# Patient Record
Sex: Male | Born: 1955
Health system: Southern US, Community
[De-identification: ages and names within clinical notes are randomized; demographics above are authoritative.]

## PROBLEM LIST (undated history)

## (undated) DIAGNOSIS — I10 Essential (primary) hypertension: Secondary | ICD-10-CM

## (undated) DIAGNOSIS — E119 Type 2 diabetes mellitus without complications: Secondary | ICD-10-CM

## (undated) DIAGNOSIS — E785 Hyperlipidemia, unspecified: Secondary | ICD-10-CM

## (undated) DIAGNOSIS — Z9289 Personal history of other medical treatment: Secondary | ICD-10-CM

## (undated) HISTORY — DX: Personal history of other medical treatment: Z92.89

## (undated) HISTORY — DX: Hyperlipidemia, unspecified: E78.5

## (undated) HISTORY — DX: Essential (primary) hypertension: I10

---

## 1998-08-01 ENCOUNTER — Encounter: Payer: Self-pay | Admitting: Emergency Medicine

## 1998-08-01 ENCOUNTER — Emergency Department (HOSPITAL_COMMUNITY): Admission: EM | Admit: 1998-08-01 | Discharge: 1998-08-01 | Payer: Self-pay | Admitting: Emergency Medicine

## 2008-11-21 ENCOUNTER — Ambulatory Visit: Payer: Self-pay | Admitting: Occupational Medicine

## 2008-11-22 ENCOUNTER — Ambulatory Visit: Payer: Self-pay | Admitting: Occupational Medicine

## 2008-12-21 ENCOUNTER — Ambulatory Visit: Payer: Self-pay | Admitting: Family Medicine

## 2008-12-21 DIAGNOSIS — N400 Enlarged prostate without lower urinary tract symptoms: Secondary | ICD-10-CM | POA: Insufficient documentation

## 2008-12-22 ENCOUNTER — Encounter: Payer: Self-pay | Admitting: Family Medicine

## 2008-12-22 LAB — CONVERTED CEMR LAB: PSA: 0.28 ng/mL (ref 0.10–4.00)

## 2008-12-28 DIAGNOSIS — E785 Hyperlipidemia, unspecified: Secondary | ICD-10-CM | POA: Insufficient documentation

## 2009-02-04 ENCOUNTER — Telehealth: Payer: Self-pay | Admitting: Family Medicine

## 2009-08-20 ENCOUNTER — Telehealth (INDEPENDENT_AMBULATORY_CARE_PROVIDER_SITE_OTHER): Payer: Self-pay | Admitting: *Deleted

## 2010-02-02 ENCOUNTER — Ambulatory Visit: Payer: Self-pay | Admitting: Family Medicine

## 2010-02-02 DIAGNOSIS — L219 Seborrheic dermatitis, unspecified: Secondary | ICD-10-CM | POA: Insufficient documentation

## 2010-02-02 DIAGNOSIS — R351 Nocturia: Secondary | ICD-10-CM | POA: Insufficient documentation

## 2010-02-02 DIAGNOSIS — R319 Hematuria, unspecified: Secondary | ICD-10-CM | POA: Insufficient documentation

## 2010-02-02 DIAGNOSIS — I10 Essential (primary) hypertension: Secondary | ICD-10-CM | POA: Insufficient documentation

## 2010-02-02 LAB — CONVERTED CEMR LAB
Bilirubin Urine: NEGATIVE
Glucose, Urine, Semiquant: NEGATIVE
Ketones, urine, test strip: NEGATIVE
Nitrite: NEGATIVE
Protein, U semiquant: NEGATIVE
Specific Gravity, Urine: 1.015
Urobilinogen, UA: 0.2
WBC Urine, dipstick: NEGATIVE
pH: 6

## 2010-02-03 LAB — CONVERTED CEMR LAB
ALT: 20 units/L (ref 0–53)
AST: 17 units/L (ref 0–37)
Albumin: 4.6 g/dL (ref 3.5–5.2)
Alkaline Phosphatase: 65 units/L (ref 39–117)
BUN: 13 mg/dL (ref 6–23)
Bacteria, UA: NONE SEEN
CO2: 27 meq/L (ref 19–32)
Calcium: 9.6 mg/dL (ref 8.4–10.5)
Casts: NONE SEEN /lpf
Chloride: 103 meq/L (ref 96–112)
Cholesterol: 306 mg/dL — ABNORMAL HIGH (ref 0–200)
Creatinine, Ser: 0.99 mg/dL (ref 0.40–1.50)
Crystals: NONE SEEN
Glucose, Bld: 129 mg/dL — ABNORMAL HIGH (ref 70–99)
HDL: 32 mg/dL — ABNORMAL LOW (ref >39)
LDL Cholesterol: 226 mg/dL — ABNORMAL HIGH (ref 0–99)
PSA: 0.37 ng/mL (ref 0.10–4.00)
Potassium: 4 meq/L (ref 3.5–5.3)
RBC / HPF: NONE SEEN (ref <3)
Sodium: 139 meq/L (ref 135–145)
Squamous Epithelial / HPF: NONE SEEN /lpf
Total Bilirubin: 0.4 mg/dL (ref 0.3–1.2)
Total CHOL/HDL Ratio: 9.6
Total Protein: 7.4 g/dL (ref 6.0–8.3)
Triglycerides: 242 mg/dL — ABNORMAL HIGH (ref <150)
VLDL: 48 mg/dL — ABNORMAL HIGH (ref 0–40)
WBC, UA: NONE SEEN cells/hpf (ref <3)

## 2010-02-22 ENCOUNTER — Ambulatory Visit: Payer: Self-pay | Admitting: Family Medicine

## 2010-02-22 DIAGNOSIS — E118 Type 2 diabetes mellitus with unspecified complications: Secondary | ICD-10-CM | POA: Insufficient documentation

## 2010-02-22 DIAGNOSIS — E119 Type 2 diabetes mellitus without complications: Secondary | ICD-10-CM

## 2010-02-22 LAB — CONVERTED CEMR LAB: Hgb A1c MFr Bld: 7.5 %

## 2010-03-09 ENCOUNTER — Telehealth: Payer: Self-pay | Admitting: Family Medicine

## 2010-03-31 ENCOUNTER — Encounter: Payer: Self-pay | Admitting: Family Medicine

## 2010-04-19 ENCOUNTER — Encounter: Payer: Self-pay | Admitting: Family Medicine

## 2010-07-04 ENCOUNTER — Ambulatory Visit: Payer: Self-pay | Admitting: Family Medicine

## 2010-07-04 LAB — CONVERTED CEMR LAB
Creatinine,U: 200 mg/dL
Hgb A1c MFr Bld: 6.6 %
Microalbumin U total vol: 30 mg/L

## 2010-10-17 ENCOUNTER — Encounter: Payer: Self-pay | Admitting: Family Medicine

## 2010-10-17 ENCOUNTER — Ambulatory Visit: Payer: Self-pay | Admitting: Family Medicine

## 2010-10-18 LAB — CONVERTED CEMR LAB
Albumin: 4.7 g/dL (ref 3.5–5.2)
CO2: 28 meq/L (ref 19–32)
Calcium: 9.5 mg/dL (ref 8.4–10.5)
Chloride: 101 meq/L (ref 96–112)
Cholesterol: 279 mg/dL — ABNORMAL HIGH (ref 0–200)
Glucose, Bld: 133 mg/dL — ABNORMAL HIGH (ref 70–99)
Sodium: 138 meq/L (ref 135–145)
Total Bilirubin: 0.3 mg/dL (ref 0.3–1.2)
Total Protein: 7.4 g/dL (ref 6.0–8.3)
Triglycerides: 170 mg/dL — ABNORMAL HIGH (ref <150)
VLDL: 34 mg/dL (ref 0–40)

## 2010-12-04 LAB — CONVERTED CEMR LAB
ALT: 20 units/L (ref 0–53)
AST: 16 units/L (ref 0–37)
Albumin: 4.5 g/dL (ref 3.5–5.2)
Alkaline Phosphatase: 68 units/L (ref 39–117)
BUN: 15 mg/dL (ref 6–23)
CO2: 26 meq/L (ref 19–32)
Calcium: 9.8 mg/dL (ref 8.4–10.5)
Chloride: 104 meq/L (ref 96–112)
Cholesterol: 301 mg/dL — ABNORMAL HIGH (ref 0–200)
Creatinine, Ser: 0.98 mg/dL (ref 0.40–1.50)
Glucose, Bld: 127 mg/dL — ABNORMAL HIGH (ref 70–99)
HDL: 37 mg/dL — ABNORMAL LOW (ref >39)
LDL Cholesterol: 210 mg/dL — ABNORMAL HIGH (ref 0–99)
Potassium: 4.5 meq/L (ref 3.5–5.3)
Sodium: 141 meq/L (ref 135–145)
TSH: 0.543 microintl units/mL (ref 0.350–4.50)
Total Bilirubin: 0.3 mg/dL (ref 0.3–1.2)
Total CHOL/HDL Ratio: 8.1
Total Protein: 7.5 g/dL (ref 6.0–8.3)
Triglycerides: 269 mg/dL — ABNORMAL HIGH (ref <150)
VLDL: 54 mg/dL — ABNORMAL HIGH (ref 0–40)

## 2010-12-08 NOTE — Assessment & Plan Note (Signed)
Summary: f/u on DM, HTN   Vital Signs:  Patient profile:   55 year old male Height:      68.5 inches Weight:      211 pounds Pulse rate:   121 / minute BP sitting:   135 / 77  (left arm) Cuff size:   large  Vitals Entered By: Kathlene November (July 04, 2010 11:22 AM) CC: sugar check up  Flu Vaccine Consent Questions     Do you have a history of severe allergic reactions to this vaccine? no    Any prior history of allergic reactions to egg and/or gelatin? no    Do you have a sensitivity to the preservative Thimersol? no    Do you have a past history of Guillan-Barre Syndrome? no    Do you currently have an acute febrile illness? no    Have you ever had a severe reaction to latex? no    Vaccine information given and explained to patient? yes    Are you currently pregnant? no    Lot Number:[id number]   Exp Date:05/06/2011   Site Given  Left Deltoid IM  Primary Care Provider:  Nani Gasser MD  CC:  sugar check up.  History of Present Illness: The patient reports that he has attempted to cut down his caloric intake to 2500  per day and that he has For Breakfast, he reports that he has a couple of banannas or an apple with coffee, for lunch he is having a soup or salad with a chicken sandwich and for dinner he will have a Ruben sandwich with small fries.  He claims to have cut down his intake of sweet tea to 2 per week and has a biscuit maybe twice per week.  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   He has not met with a diabetes educator or dietician yet.  His exercise regimen has not been as consistent as he would like as well.  He denies polydipsia, dizziness, blurry vision, worsening of athelete's foot or chest pain or palpitations.    Diabetes Management History:      He says that he is not exercising regularly.    Current Medications (verified): 1)  Tribenzor 40-10-25 Mg Tabs (Olmesartan-Amlodipine-Hctz) .... Take 1 Tablet By Mouth  Once A Day 2)  Crestor 20 Mg Tabs (Rosuvastatin Calcium) .... Once A Day At Bedtime By Mouth 3)  Glucometer .... Strips and Lancets To Check Daily. Dx: 250.00  Allergies (verified): No Known Drug Allergies  Comments:  Nurse/Medical Assistant: The patient's medications and allergies were reviewed with the patient and were updated in the Medication and Allergy Lists. Kathlene November (July 04, 2010 11:23 AM)  Physical Exam  General:  alert, well-developed, and appropriate dress.   Head:  Normocephalic and atraumatic without obvious abnormalities. No apparent alopecia or balding. Lungs:  Clear to auscultation bilaterallly with no rales, rhonchi, or wheezes. Heart:  normal rate, regular rhythm, no murmur, no gallop, and no rub.   Abdomen:  soft, non-tender, and normal bowel sounds.   Pulses:  2+ bilaterally in all modalities tested Neurologic:  DTRs symmetrical and normal.   Psych:  normally interactive and good eye contact.    Diabetes Management Exam:    Foot Exam (with socks and/or shoes not present):       Sensory-Pinprick/Light touch:          Left medial foot (L-4): normal  Left dorsal foot (L-5): normal          Left lateral foot (S-1): normal          Right medial foot (L-4): normal          Right dorsal foot (L-5): normal          Right lateral foot (S-1): normal       Sensory-Monofilament:          Left foot: normal          Right foot: normal       Sensory-other: Vibratory sense normal bilaterally       Inspection:          Left foot: abnormal             Comments: Tinea Pedis Present          Right foot: abnormal             Comments: Tinea Pedis Present   Impression & Recommendations:  Problem # 1:  DIABETES MELLITUS (ICD-250.00) Congradulated patient about recent weight loss and dietary improvements that decreased A1C to 6.6%.  Strongly encouraged patient to follow up with a diabetes educator and a dietician.  Also suggested and encoraged home cooked  meals and the avodance of fast foods.  Also continued to stress the need for exercise.   Pnumococcoal and flu vac given today Urien micro negative.  REminded to get diabetic eye check and gave him information.  His updated medication list for this problem includes:    Tribenzor 40-10-25 Mg Tabs (Olmesartan-amlodipine-hctz) .Marland Kitchen... Take 1 tablet by mouth once a day  Orders: Fingerstick (36416) Hemoglobin A1C (83036) Creatinine  (16109) Urine Microalbumin (60454)  His updated medication list for this problem includes:    Tribenzor 40-10-25 Mg Tabs (Olmesartan-amlodipine-hctz) .Marland Kitchen... Take 1 tablet by mouth once a day  Labs Reviewed: Creat: 0.99 (02/02/2010)    Reviewed HgBA1c results: 7.5 (02/22/2010)  Problem # 2:  HYPERTENSION, BENIGN (ICD-401.1) Today the patient was almost at goal.  Would like for the patient to try and lose more weight before starting a beta-blocker. His updated medication list for this problem includes:    Tribenzor 40-10-25 Mg Tabs (Olmesartan-amlodipine-hctz) .Marland Kitchen... Take 1 tablet by mouth once a day  BP today: 135/77 Prior BP: 153/86 (02/22/2010)  Labs Reviewed: K+: 4.0 (02/02/2010) Creat: : 0.99 (02/02/2010)   Chol: 306 (02/02/2010)   HDL: 32 (02/02/2010)   LDL: 226 (02/02/2010)   TG: 242 (02/02/2010)  Complete Medication List: 1)  Tribenzor 40-10-25 Mg Tabs (Olmesartan-amlodipine-hctz) .... Take 1 tablet by mouth once a day 2)  Crestor 20 Mg Tabs (Rosuvastatin calcium) .... Once a day at bedtime by mouth 3)  Glucometer  .... Strips and lancets to check daily. dx: 250.00  Other Orders: Admin 1st Vaccine (09811) Flu Vaccine 45yrs + (91478) Pneumococcal Vaccine (29562) Admin of Any Addtl Vaccine (13086)  Patient Instructions: 1)  Given Pneumococcal and flu vaccine today. 2)  Please schedule a follow-up appointment in 3 months .  3)  Make sure to get your eye exam this year.   4)  [facility]: [address], [address],  [phone] Prescriptions: CRESTOR 20 MG TABS (ROSUVASTATIN CALCIUM) once a day at bedtime by mouth  #90 x 1   Entered and Authorized by:   Nani Gasser MD   Signed by:   Nani Gasser MD on 07/04/2010   Method used:   Electronically to        [facility]  [address] [phone]* (retail)       [address] [place], [place]  [address]       Ph: [phone]       Fax: [phone]   RxID:   [id number] Marya Landry 40-10-25 MG TABS Loura Halt) Take 1 tablet by mouth once a day  #90 x 0   Entered and Authorized by:   Nani Gasser MD   Signed by:   Nani Gasser MD on 07/04/2010   Method used:   Electronically to        [facility] [phone]* (retail)       [address] [place], [place]  [address]       Ph: [phone]       Fax: [phone]   RxID:   [id number]   Laboratory Results   Urine Tests  Date/Time Received: 07/04/2010 Date/Time Reported: 07/04/2010  Microalbumin (urine): 30 mg/L Creatinine: 200mg /dL  A:C Ratio 30mg /g  Blood Tests   Date/Time Received: 07/02/2010 Date/Time Reported: 07/02/2010  HGBA1C: 6.6%   (Normal Range: Non-Diabetic - 3-6%   Control Diabetic - 6-8%)        Immunizations Administered:  Pneumonia Vaccine:    Vaccine Type: Pneumovax    Site: left deltoid    Mfr: Merck    Dose: 0.5 ml    Route: IM    Given by: Kathlene November    Exp. Date: 01/21/2012    Lot #: [id number]    VIS given: 06/03/96 version given July 04, 2010.

## 2010-12-08 NOTE — Progress Notes (Signed)
Summary: Glucometer  Phone Note Call from Patient Call back at Home Phone [phone]   Caller: Patient Call For: Nani Gasser MD Summary of Call: Pt calls and needs rx for Glucometer, test strips and lancets sent to [facility]. Went to pick up after appointment and they did not have anything. Initial call taken by: Kathlene November,  Mar 09, 2010 11:43 AM  Follow-up for Phone Call        PT was already given rx.  Can reprint and fax or pt can pick up. It was printed same day as his appt.  Follow-up by: Nani Gasser MD,  Mar 09, 2010 12:25 PM    Prescriptions: GLUCOMETER Strips and lancets to check daily. Dx: 250.00  #30 days supp x 4   Entered by:   Kathlene November   Authorized by:   Nani Gasser MD   Signed by:   Kathlene November on 03/09/2010   Method used:   Print then Give to Patient   RxID:   [id number]

## 2010-12-08 NOTE — Procedures (Signed)
Summary: Colonoscopy/[facility]  Colonoscopy/[facility]   Imported By: Lanelle Bal 04/26/2010 12:02:05  _____________________________________________________________________  External Attachment:    Type:   Image     Comment:   External Document  Appended Document: Colonoscopy/[facility]  Flex Sig Next Due:  Not Indicated Colonoscopy Result Date:  04/19/2010 Colonoscopy Result:  normal Colonoscopy Next Due:  10 yr Hemoccult Next Due:  Not Indicated Last PSA Result:  0.37 (02/02/2010 11:53:00 PM) PSA Next Due:  1 yr

## 2010-12-08 NOTE — Letter (Signed)
Summary: Patient No Show/[facility]  Patient No Show/[facility]   Imported By: Lanelle Bal 04/12/2010 11:48:36  _____________________________________________________________________  External Attachment:    Type:   Image     Comment:   External Document

## 2010-12-08 NOTE — Assessment & Plan Note (Signed)
Summary: New dx DM, f/u on HTN   Vital Signs:  Patient profile:   55 year old male Height:      68.5 inches Weight:      219 pounds BMI:     32.93 Pulse rate:   105 / minute BP sitting:   153 / 86  (left arm) Cuff size:   large  Vitals Entered By: Kathlene November (February 22, 2010 8:25 AM) CC: discuss diabetes   Primary Care Provider:  Nani Gasser MD  CC:  discuss diabetes.  History of Present Illness: Just restarted his crestor about 4 days ago. Had been off of it for awhile. Her to f/u elevated glucose. It was 129 on his recent labs.   Current Medications (verified): 1)  Tribenzor 40-5-12.5 Mg Tabs (Olmesartan-Amlodipine-Hctz) .... Take 1 Tablet By Mouth Once A Day 2)  Crestor 20 Mg Tabs (Rosuvastatin Calcium) .... Once A Day At Bedtime By Mouth 3)  Ketoconazole 2 % Sham (Ketoconazole) .... Wash With Shampoo Daily For 2 Weeks, Then Twice A Week For Maintenance.  Allergies (verified): No Known Drug Allergies  Comments:  Nurse/Medical Assistant: The patient's medications and allergies were reviewed with the patient and were updated in the Medication and Allergy Lists. Kathlene November (February 22, 2010 8:26 AM)  Physical Exam  General:  Well-developed,well-nourished,in no acute distress; alert,appropriate and cooperative throughout examination Neck:  No deformities, masses, or tenderness noted. Lungs:  Normal respiratory effort, chest expands symmetrically. Lungs are clear to auscultation, no crackles or wheezes. Heart:  Normal rate and regular rhythm. S1 and S2 normal without gallop, murmur, click, rub or other extra sounds. Skin:  no suspicious lesions.   Cervical Nodes:  No lymphadenopathy noted Psych:  Cognition and judgment appear intact. Alert and cooperative with normal attention span and concentration. No apparent delusions, illusions, hallucinations   Impression & Recommendations:  Problem # 1:  DIABETES MELLITUS (ICD-250.00) Assessment New  Discussed that  based on his A1C he does have diabetes. Will work really hard on diet, weight loss, and exercise and f/u for repeat A1C in 3 months. Sicussed that if A1C not below 6.5 at that time will need to start a medication.  In meantime will give him rx for glucometer and have him check fasting sugars for 2 months. Given H.O. on carb and protion sizes and also some information on what an A1C is. Will refer for diabetes and nutrition counseling here in [place]. At next visit check microalbumin, discuss eye exam and give pneumococcal vaccines. 25 min spent face to face in counseling.  His updated medication list for this problem includes:    Tribenzor 40-10-25 Mg Tabs (Olmesartan-amlodipine-hctz) .Marland Kitchen... Take 1 tablet by mouth once a day  Orders: Fingerstick (36416) Hgb A1C (44034VQ) Diabetic Clinic Referral (Diabetic)  Problem # 2:  HYPERTENSION, BENIGN (ICD-401.1) Still not well controlled so will increase his dose on teh HCTZ and the amlodipine compoenents. REcheck at f/u in 3 months.  If still not well controlled consider adding a beta blocker.  His updated medication list for this problem includes:    Tribenzor 40-10-25 Mg Tabs (Olmesartan-amlodipine-hctz) .Marland Kitchen... Take 1 tablet by mouth once a day  Complete Medication List: 1)  Tribenzor 40-10-25 Mg Tabs (Olmesartan-amlodipine-hctz) .... Take 1 tablet by mouth once a day 2)  Crestor 20 Mg Tabs (Rosuvastatin calcium) .... Once a day at bedtime by mouth 3)  Ketoconazole 2 % Sham (Ketoconazole) .... Wash with shampoo daily for 2 weeks, then twice a week for maintenance.  4)  Glucometer  .... Strips and lancets to check daily. dx: 250.00   Patient Instructions: 1)  Check sugars twice a week.  2)  Please schedule a follow-up appointment in 3 months .  Will check your urine at the next visit.   Prescriptions: GLUCOMETER Strips and lancets to check daily. Dx: 250.00  #30 days supp x 4   Entered and Authorized by:   Nani Gasser MD   Signed by:    Nani Gasser MD on 02/22/2010   Method used:   Print then Give to Patient   RxID:   [id number] 40-10-25 MG TABS [facility]) Take 1 tablet by mouth once a day  #30 x 1   Entered and Authorized by:   Nani Gasser MD   Signed by:   Nani Gasser MD on 02/22/2010   Method used:   Electronically to        [facility] [phone]* (retail)       [address] [place], [place]  [address]       Ph: [phone]       Fax: [phone]   RxID:   [id number]   Laboratory Results   Blood Tests   Date/Time Received: 02/22/2010 Date/Time Reported: 02/22/2010  HGBA1C: 7.5%   (Normal Range: Non-Diabetic - 3-6%   Control Diabetic - 6-8%)

## 2010-12-08 NOTE — Assessment & Plan Note (Signed)
Summary: 3 mo f/u DM, HTN, etc   Vital Signs:  Patient profile:   55 year old male Height:      68.5 inches Weight:      214 pounds Pulse rate:   84 / minute BP sitting:   135 / 90  (right arm) Cuff size:   large  Vitals Entered By: Avon Gully CMA, Duncan Dull) (October 17, 2010 8:52 AM) CC: F/u DM and Hiogh BLd pressure, Lipid Management   CC:  F/u DM and Hiogh BLd pressure and Lipid Management.  Diabetes Management History:      The patient is a 55 years old male who comes in for evaluation of DM Type 2.  He has not been enrolled in the "Diabetic Education Program".  He states understanding of dietary principles and is following his diet appropriately.  No sensory loss is reported.  Self foot exams are being performed.  He says that he is not exercising regularly.        Hypoglycemic symptoms are not occurring.  No hyperglycemic symptoms are reported.        Since his last visit, no infections have occurred.  No changes have been made to his treatment plan since last visit.    Lipid Management History:      Positive NCEP/ATP III risk factors include male age 54 years old or older, diabetes, HDL cholesterol less than 40, and hypertension.  Negative NCEP/ATP III risk factors include no family history for ischemic heart disease and non-tobacco-user status.     Current Medications (verified): 1)  Tribenzor 40-10-25 Mg Tabs (Olmesartan-Amlodipine-Hctz) .... Take 1 Tablet By Mouth Once A Day 2)  Crestor 20 Mg Tabs (Rosuvastatin Calcium) .... Once A Day At Bedtime By Mouth 3)  Glucometer .... Strips and Lancets To Check Daily. Dx: 250.00  Allergies (verified): No Known Drug Allergies  Comments:  Nurse/Medical Assistant: The patient's medications and allergies were reviewed with the patient and were updated in the Medication and Allergy Lists. Avon Gully CMA, Duncan Dull) (October 17, 2010 8:53 AM)  Physical Exam  General:  Well-developed,well-nourished,in no acute distress;  alert,appropriate and cooperative throughout examination Head:  Normocephalic and atraumatic without obvious abnormalities. No apparent alopecia or balding. Eyes:  No corneal or conjunctival inflammation noted. EOMI. Perrla. Lungs:  Normal respiratory effort, chest expands symmetrically. Lungs are clear to auscultation, no crackles or wheezes. Heart:  Normal rate and regular rhythm. S1 and S2 normal without gallop, click, rub or other extra sounds. 1/6 SEM.  Skin:  no rashes.   Cervical Nodes:  No lymphadenopathy noted Psych:  Cognition and judgment appear intact. Alert and cooperative with normal attention span and concentration. No apparent delusions, illusions, hallucinations   Impression & Recommendations:  Problem # 1:  HYPERTENSION, BENIGN (ICD-401.1) Recently started working out about 2 weeks ago. He is trying to lose weight. His goal is to lose 20 lbs. I told him this would be wonderful and would help control his BP without having to make adjustments to his meds. If still SBP 135 net time then call add betablocker.  His updated medication list for this problem includes:    Tribenzor 40-10-25 Mg Tabs (Olmesartan-amlodipine-hctz) .Marland Kitchen... Take 1 tablet by mouth once a day  Problem # 2:  DIABETES MELLITUS (ICD-250.00) A1C 6.6. He is at goal.  Has cut out the sugars and sweets. Encourage to lose 10 lbs. Also discussed that if can continue to do a great job will be able to hold off on medications.  His updated medication list for this problem includes:    Tribenzor 40-10-25 Mg Tabs (Olmesartan-amlodipine-hctz) .Marland Kitchen... Take 1 tablet by mouth once a day  Orders: Fingerstick (36416) Hemoglobin A1C (83036)  Problem # 3:  HYPERLIPIDEMIA (ICD-272.4) Overdue to recheck chol.  He is toelrating the statin well.  His updated medication list for this problem includes:    Crestor 20 Mg Tabs (Rosuvastatin calcium) ..... Once a day at bedtime by mouth  Orders: T-Lipid Profile  ([address]-60454) T-Comprehensive Metabolic Panel [phone])  Complete Medication List: 1)  Tribenzor 40-10-25 Mg Tabs (Olmesartan-amlodipine-hctz) .... Take 1 tablet by mouth once a day 2)  Crestor 20 Mg Tabs (Rosuvastatin calcium) .... Once a day at bedtime by mouth 3)  Glucometer  .... Strips and lancets to check daily. dx: 250.00 4)  Cialis 20 Mg Tabs (Tadalafil) .... Take 1 tablet by mouth once a day as needed  Diabetes Management Assessment/Plan:      The following lipid goals have been established for the patient: Total cholesterol goal of 200; LDL cholesterol goal of 100; HDL cholesterol goal of 40; Triglyceride goal of 150.    Lipid Assessment/Plan:      Based on NCEP/ATP III, the patient's risk factor category is "history of diabetes".  The patient's lipid goals are as follows: Total cholesterol goal is 200; LDL cholesterol goal is 100; HDL cholesterol goal is 40; Triglyceride goal is 150.    Patient Instructions: 1)  Please schedule a follow-up appointment in 3 months for diabetes and blood pressure.  2)  Remember to get your eye exam.  Prescriptions: CIALIS 20 MG TABS (TADALAFIL) Take 1 tablet by mouth once a day as needed  #10 x 11   Entered and Authorized by:   Nani Gasser MD   Signed by:   Nani Gasser MD on 10/17/2010   Method used:   Electronically to        [facility] [phone]* (retail)       [address] [place], [place]  [address]       Ph: [phone]       Fax: [phone]   RxID:   [id number] CRESTOR 20 MG TABS (ROSUVASTATIN CALCIUM) once a day at bedtime by mouth  #90 x 2   Entered and Authorized by:   Nani Gasser MD   Signed by:   Nani Gasser MD on 10/17/2010   Method used:   Electronically to        [facility] [phone]* (retail)       [address] [place], [place]  [address]       Ph: [phone]       Fax: [phone]   RxID:   [id number] TRIBENZOR 40-10-25 MG TABS Loura Halt) Take 1  tablet by mouth once a day  #90 x 2   Entered and Authorized by:   Nani Gasser MD   Signed by:   Nani Gasser MD on 10/17/2010   Method used:   Electronically to        [facility] [phone]* (retail)       [address] [place], [place]  [address]       Ph: [phone]       Fax: [phone]   RxID:   [id number]    Orders Added: 1)  Fingerstick [36416] 2)  Hemoglobin A1C [83036] 3)  T-Lipid Profile [48546-27035]  4)  T-Comprehensive Metabolic Panel [80053-22900] 5)  Est. Patient Level III [14782]    Laboratory Results   Blood Tests   Date/Time Received: 10/17/10 Date/Time Reported: 10/17/10       Appended Document: 3 mo f/u DM, HTN, etc  Laboratory Results   Blood Tests   Date/Time Received: December 12, 20119:47 AM   HGBA1C: 6.6%   (Normal Range: Non-Diabetic - 3-6%   Control Diabetic - 6-8%)

## 2010-12-08 NOTE — Assessment & Plan Note (Signed)
Summary: CPE   Vital Signs:  Patient profile:   55 year old male Height:      68.5 inches Weight:      220 pounds BMI:     33.08 Pulse rate:   93 / minute BP sitting:   158 / 90  (left arm) Cuff size:   large  Vitals Entered By: Kathlene November (February 02, 2010 10:49 AM) CC: CPE   Primary Care Provider:  Nani Gasser MD  CC:  CPE.  History of Present Illness: Having to pee twice at night for years. Dneis any problems during the day with urinary stream or hesisancey. Denies any hematuria but says on his DOT physical was told he had some protein in his urine.  c/o dry flakey itchy patches on his scalp and neck.    Current Medications (verified): 1)  Lisinopril 40 Mg Tabs (Lisinopril) .... Take 1 Tablet By Mouth Once A Day 2)  Amlodipine Besylate 10 Mg Tabs (Amlodipine Besylate) .... Take 1 Tablet By Mouth Once A Day 3)  Crestor 20 Mg Tabs (Rosuvastatin Calcium) .... Once A Day At Bedtime By Mouth  Allergies (verified): No Known Drug Allergies  Comments:  Nurse/Medical Assistant: The patient's medications and allergies were reviewed with the patient and were updated in the Medication and Allergy Lists. 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  HS degree. Divorced.  Lives alone. denies tobacco use drinks 12 per month denies recreational drug use  Review of Systems  The patient denies anorexia, fever, weight loss, weight gain, vision loss, decreased hearing, hoarseness, chest pain, syncope, dyspnea on exertion, peripheral edema, prolonged cough, headaches, hemoptysis, abdominal pain,  melena, hematochezia, severe indigestion/heartburn, hematuria, incontinence, genital sores, muscle weakness, suspicious skin lesions, transient blindness, difficulty walking, depression, unusual weight change, abnormal bleeding, enlarged lymph nodes, and breast masses.    Physical Exam  General:  Well-developed,well-nourished,in no acute distress; alert,appropriate and cooperative throughout examination. Obese.  Head:  Normocephalic and atraumatic without obvious abnormalities. No apparent alopecia or balding. Eyes:  No corneal or conjunctival inflammation noted. EOMI. Perrla. Ears:  External ear exam shows no significant lesions or deformities.  Otoscopic examination reveals clear canals, tympanic membranes are intact bilaterally without bulging, retraction, inflammation or discharge. Hearing is grossly normal bilaterally. Nose:  External nasal examination shows no deformity or inflammation. Nasal mucosa are pink and moist without lesions or exudates. Mouth:  Oral mucosa and oropharynx without lesions or exudates.  Teeth in good repair. Neck:  No deformities, masses, or tenderness noted. Chest Wall:  No deformities, masses, tenderness or gynecomastia noted. Lungs:  Normal respiratory effort, chest expands symmetrically. Lungs are clear to auscultation, no crackles or wheezes. Heart:  Normal rate and regular rhythm. S1 and S2 normal without gallop, murmur, click, rub or other extra sounds. Abdomen:  Bowel sounds positive,abdomen soft and non-tender without masses, organomegaly or hernias noted. Rectal:  no external abnormalities.   Prostate:  1+ enlarged.   Msk:  No deformity or scoliosis noted of thoracic or lumbar spine.   Pulses:  R and L carotid,radial,dorsalis pedis and posterior tibial pulses are full and equal bilaterally Extremities:  No clubbing, cyanosis, edema, or deformity  noted with normal full range of motion of all joints.   Neurologic:  No cranial nerve deficits noted. Station and  gait are normal. DTRs are symmetrical throughout. Sensory, motor and coordinative functions appear intact. Skin:  dry scaling patches in his scalp.  Cervical Nodes:  No lymphadenopathy noted Psych:  Cognition and judgment appear intact. Alert and cooperative with normal attention span and concentration. No apparent delusions, illusions, hallucinations   Impression & Recommendations:  Problem # 1:  HEALTH MAINTENANCE EXAM (ICD-V70.0) Due for screening labs.  Encouraged more regular exercise and weight lose. He is obest.   UA is + for blood so will send for a micro for further evaluation. Will also check his PSA. His protstate was mildly enlarged but is symmetric with no nodules. BPH could be contributing to his nightime sxs.   Orders: T-Comprehensive Metabolic Panel [id number]) T-PSA [id number]) T-Lipid Profile ([id number]) Gastroenterology Referral (GI)  Problem # 2:  DERMATITIS, SEBORRHEIC (ICD-690.10) Treat with ketoconazole shampoo.    Problem # 3:  HYPERTENSION, BENIGN (ICD-401.1) Has not been taking his betablocker. Didnt' know he was supposed to be on it. Will change him to tribenzor for simplicity and have him f/u in one month to recheck his BP.  Samples given for 4 weeks.  The following medications were removed from the medication list:    Amlodipine Besylate 10 Mg Tabs (Amlodipine besylate) .Marland Kitchen... Take 1 tablet by mouth once a day    Metoprolol Tartrate 50 Mg Tabs (Metoprolol tartrate) .Marland Kitchen... Take 1 tablet by mouth two times a day His updated medication list for this problem includes:    Tribenzor 40-5-12.5 Mg Tabs (Olmesartan-amlodipine-hctz) .Marland Kitchen... Take 1 tablet by mouth once a day  Complete Medication List: 1)  Tribenzor 40-5-12.5 Mg Tabs (Olmesartan-amlodipine-hctz) .... Take 1 tablet by mouth once a day 2)  Crestor 20 Mg Tabs (Rosuvastatin calcium) .... Once a day at bedtime by mouth 3)  Ketoconazole 2 % Sham (Ketoconazole) .... Wash with shampoo daily for 2 weeks,  then twice a week for maintenance.  Other Orders: UA Dipstick w/o Micro (automated)  ([id number]) T-Urine Microscopic ([id number])   Patient Instructions: 1)  We will schedule a colonoscopy for you to check for signs of colon cancer.   2)  We will call you with our lab results in a couple of days.   Prescriptions: KETOCONAZOLE 2 % SHAM (KETOCONAZOLE) Wash with shampoo daily for 2 weeks, then twice a week for maintenance.  #2 bottle x 3   Entered and Authorized by:   Nani Gasser MD   Signed by:   Nani Gasser MD on 02/02/2010   Method used:   Electronically to        [facility] [phone]* (retail)       [address] [place], [place]  [address]       Ph: [phone]       Fax: [phone]   RxID:   [id number]    Laboratory Results   Urine Tests  Date/Time Received: 02/02/2010 Date/Time Reported: 02/02/2010  Routine Urinalysis   Color: yellow Appearance: Clear Glucose: negative   (Normal Range: Negative) Bilirubin: negative   (Normal Range: Negative) Ketone: negative   (Normal Range: Negative) Spec. Gravity: 1.015   (Normal Range: 1.003-1.035) Blood: trace-intact   (Normal Range: Negative) pH: 6.0   (Normal Range: 5.0-8.0) Protein: negative   (Normal Range: Negative) Urobilinogen: 0.2   (Normal Range: 0-1) Nitrite: negative   (Normal Range: Negative) Leukocyte  Esterace: negative   (Normal Range: Negative)

## 2011-01-16 ENCOUNTER — Ambulatory Visit: Payer: Self-pay | Admitting: Family Medicine

## 2011-02-02 ENCOUNTER — Encounter: Payer: Self-pay | Admitting: Family Medicine

## 2011-02-06 ENCOUNTER — Ambulatory Visit: Payer: Self-pay | Admitting: Family Medicine

## 2011-03-05 ENCOUNTER — Encounter: Payer: Self-pay | Admitting: Family Medicine

## 2011-03-06 ENCOUNTER — Encounter: Payer: Self-pay | Admitting: Family Medicine

## 2011-03-06 ENCOUNTER — Ambulatory Visit (INDEPENDENT_AMBULATORY_CARE_PROVIDER_SITE_OTHER): Payer: BC Managed Care – PPO | Admitting: Family Medicine

## 2011-03-06 DIAGNOSIS — E785 Hyperlipidemia, unspecified: Secondary | ICD-10-CM

## 2011-03-06 DIAGNOSIS — E8881 Metabolic syndrome: Secondary | ICD-10-CM

## 2011-03-06 DIAGNOSIS — R7309 Other abnormal glucose: Secondary | ICD-10-CM

## 2011-03-06 DIAGNOSIS — I10 Essential (primary) hypertension: Secondary | ICD-10-CM

## 2011-03-06 MED ORDER — PITAVASTATIN CALCIUM 4 MG PO TABS: 4.0000 mg | ORAL_TABLET | Freq: Every day | ORAL | Status: DC

## 2011-03-06 NOTE — Assessment & Plan Note (Signed)
BPs are controlled at home and the med he took this AM was an old sample med since he ran out yesterday adn it was much lower dose than what he normlall takes. REfll med. Congratulated him on weight loss.  Keep up the great work.

## 2011-03-06 NOTE — Patient Instructions (Signed)
We will call you with your lab results. Follow up in 6 months 

## 2011-03-06 NOTE — Assessment & Plan Note (Signed)
Discussed dx. Will recheck A1C. If still well controlled will recheck in one year. He has done fantastic with his weight loss.

## 2011-03-06 NOTE — Progress Notes (Signed)
  Subjective:    Patient ID: ARVINE CLAYBURN, male    DOB: 12/18/1955, 55 y.o.   MRN: [id number]  Hypertension This is a chronic problem. The problem has been gradually worsening since onset. The problem is controlled. Pertinent negatives include no chest pain, peripheral edema or shortness of breath. There are no associated agents to hypertension. Risk factors for coronary artery disease include no known risk factors. Past treatments include ACE inhibitors and diuretics. There are no compliance problems.   has lost even more weight  Crestor too expensive. Wants an alternative.     Review of Systems  Respiratory: Negative for shortness of breath.   Cardiovascular: Negative for chest pain.       Objective:   Physical Exam  Constitutional: He is oriented to person, place, and time. He appears well-developed and well-nourished.  HENT:  Head: Normocephalic and atraumatic.  Cardiovascular: Normal rate, regular rhythm and normal heart sounds.   Pulmonary/Chest: Effort normal and breath sounds normal.  Neurological: He is alert and oriented to person, place, and time.  Skin: Skin is warm and dry.  Psychiatric: He has a normal mood and affect.          Assessment & Plan:

## 2011-03-06 NOTE — Assessment & Plan Note (Signed)
Will change to livalo with coupon card for cost reason.  Recheck lipids in 6 mo.

## 2011-03-07 ENCOUNTER — Telehealth: Payer: Self-pay | Admitting: Family Medicine

## 2011-03-07 LAB — COMPLETE METABOLIC PANEL WITH GFR
Albumin: 4.8 g/dL (ref 3.5–5.2)
CO2: 28 mEq/L (ref 19–32)
Calcium: 9.8 mg/dL (ref 8.4–10.5)
Chloride: 100 mEq/L (ref 96–112)
GFR, Est African American: >60 mL/min (ref >60)
GFR, Est Non African American: >60 mL/min (ref >60)
Glucose, Bld: 119 mg/dL — ABNORMAL HIGH (ref 70–99)
Potassium: 4.4 mEq/L (ref 3.5–5.3)
Sodium: 139 mEq/L (ref 135–145)
Total Protein: 7.7 g/dL (ref 6.0–8.3)

## 2011-03-07 LAB — LIPID PANEL: LDL Cholesterol: 135 mg/dL — ABNORMAL HIGH (ref 0–99)

## 2011-03-07 NOTE — Telephone Encounter (Signed)
Call pt: LDL is down into the 130s. Much improved.  Lets recheck in 6 months on the new chol pill call livalo.  Continue to exercise and eat a low fat diet.

## 2011-03-07 NOTE — Telephone Encounter (Signed)
Left message with results on vm and to return to lab to check A1c because he didn't get lab slip at appt

## 2011-04-14 ENCOUNTER — Encounter: Payer: Self-pay | Admitting: Family Medicine

## 2011-08-26 ENCOUNTER — Other Ambulatory Visit: Payer: Self-pay | Admitting: Family Medicine

## 2011-08-28 ENCOUNTER — Other Ambulatory Visit: Payer: Self-pay | Admitting: *Deleted

## 2011-08-28 MED ORDER — OLMESARTAN-AMLODIPINE-HCTZ 40-10-25 MG PO TABS: 40.0000 mg | ORAL_TABLET | Freq: Every day | ORAL | Status: DC

## 2011-10-02 ENCOUNTER — Ambulatory Visit: Payer: BC Managed Care – PPO | Admitting: Family Medicine

## 2012-06-03 ENCOUNTER — Other Ambulatory Visit: Payer: Self-pay | Admitting: *Deleted

## 2012-06-03 MED ORDER — PITAVASTATIN CALCIUM 4 MG PO TABS: 4.0000 mg | ORAL_TABLET | Freq: Every day | ORAL | Status: DC

## 2012-07-01 ENCOUNTER — Telehealth: Payer: Self-pay | Admitting: Family Medicine

## 2012-07-01 NOTE — Telephone Encounter (Signed)
Patient notified and sent to FD to be scheduled

## 2012-07-01 NOTE — Telephone Encounter (Signed)
Call pt: Neesd diabetic appt

## 2012-07-15 ENCOUNTER — Ambulatory Visit (INDEPENDENT_AMBULATORY_CARE_PROVIDER_SITE_OTHER): Payer: BC Managed Care – PPO | Admitting: Family Medicine

## 2012-07-15 ENCOUNTER — Encounter: Payer: Self-pay | Admitting: Family Medicine

## 2012-07-15 VITALS — BP 143/89 | HR 105 | Wt 209.0 lb

## 2012-07-15 DIAGNOSIS — I10 Essential (primary) hypertension: Secondary | ICD-10-CM

## 2012-07-15 DIAGNOSIS — E785 Hyperlipidemia, unspecified: Secondary | ICD-10-CM

## 2012-07-15 DIAGNOSIS — E119 Type 2 diabetes mellitus without complications: Secondary | ICD-10-CM

## 2012-07-15 LAB — POCT GLYCOSYLATED HEMOGLOBIN (HGB A1C): Hemoglobin A1C: 6.9

## 2012-07-15 LAB — POCT UA - MICROALBUMIN
Albumin/Creatinine Ratio, Urine, POC: <30
Creatinine, POC: 200 mg/dL

## 2012-07-15 MED ORDER — OLMESARTAN-AMLODIPINE-HCTZ 40-10-25 MG PO TABS: 1.0000 | ORAL_TABLET | Freq: Every day | ORAL | Status: DC

## 2012-07-15 MED ORDER — PITAVASTATIN CALCIUM 4 MG PO TABS: 4.0000 mg | ORAL_TABLET | Freq: Every day | ORAL | Status: DC

## 2012-07-15 MED ORDER — METFORMIN HCL 500 MG PO TABS: 500.0000 mg | ORAL_TABLET | Freq: Two times a day (BID) | ORAL | Status: DC

## 2012-07-15 NOTE — Progress Notes (Signed)
  Subjective:    Patient ID: Steven Estrada, male    DOB: 1955/11/25, 56 y.o.   MRN: [id number]  HPI DM- Says sugars runing under 130. No hypoglycemic events.  Taking meds except for his statin. He is not on medications and he is currently diet controlled.  Hyperlpidemia - off his statin. He denies any problems with it he just ran out and no longer had refills. No myalgias on medication.  HTN- EAting a low salt diet.  Not working out for 3 months. No chest pain or shortness of breath. Tolerating medications well.    Review of Systems     Objective:   Physical Exam  Constitutional: He is oriented to person, place, and time. He appears well-developed and well-nourished.  HENT:  Head: Normocephalic and atraumatic.  Cardiovascular: Normal rate, regular rhythm and normal heart sounds.   Pulmonary/Chest: Effort normal and breath sounds normal.  Neurological: He is alert and oriented to person, place, and time.  Skin: Skin is warm and dry.  Psychiatric: He has a normal mood and affect. His behavior is normal.          Assessment & Plan:  DM- His A1C is 6.9.  Elevated . Will start metformin 500mg  bid. Discussed potential side effects. Followup in 3 months. Start an exercise program and really make sure eating a low salt low sugar diet.  HTN - Not well controlled. Start diet and exercise. Continue work on low-salt diet. Coupon card given for [employer]. Recheck at followup in one month during his physical. BP Readings from Last 3 Encounters:  07/15/12 143/89  03/06/11 160/94  10/17/10 135/90     Hyperlipidemia-restart statin to her to the lab in about 2 weeks to recheck CMP and lipid panel.  He is also interested in getting a complete physical. Encouraged him to schedule this in one month. We can recheck his blood pressure at that time.

## 2012-07-15 NOTE — Patient Instructions (Addendum)
Schedule a CPE in one month Work on low salt diet and exercise.  Follow up for diabetes in 3 months.

## 2012-08-26 ENCOUNTER — Encounter: Payer: BC Managed Care – PPO | Admitting: Family Medicine

## 2012-09-30 ENCOUNTER — Encounter: Payer: BC Managed Care – PPO | Admitting: Family Medicine

## 2012-09-30 LAB — COMPLETE METABOLIC PANEL WITH GFR
ALT: 16 U/L (ref 0–53)
AST: 15 U/L (ref 0–37)
Albumin: 4.5 g/dL (ref 3.5–5.2)
Alkaline Phosphatase: 60 U/L (ref 39–117)
GFR, Est Non African American: 85 mL/min
Glucose, Bld: 116 mg/dL — ABNORMAL HIGH (ref 70–99)
Potassium: 4.4 mEq/L (ref 3.5–5.3)
Sodium: 136 mEq/L (ref 135–145)
Total Protein: 7.3 g/dL (ref 6.0–8.3)

## 2012-09-30 LAB — LIPID PANEL
LDL Cholesterol: 88 mg/dL (ref 0–99)
Total CHOL/HDL Ratio: 6.8 Ratio

## 2012-09-30 NOTE — Progress Notes (Signed)
  Subjective:    Patient ID: Steven Estrada, male    DOB: Feb 15, 1956, 55 y.o.   MRN: [id number]  HPI    Review of Systems     Objective:   Physical Exam        Assessment & Plan:

## 2012-10-07 ENCOUNTER — Ambulatory Visit: Payer: BC Managed Care – PPO | Admitting: Family Medicine

## 2012-10-07 DIAGNOSIS — Z0289 Encounter for other administrative examinations: Secondary | ICD-10-CM

## 2012-11-04 ENCOUNTER — Encounter: Payer: Self-pay | Admitting: Family Medicine

## 2012-11-04 ENCOUNTER — Ambulatory Visit (INDEPENDENT_AMBULATORY_CARE_PROVIDER_SITE_OTHER): Payer: BC Managed Care – PPO | Admitting: Family Medicine

## 2012-11-04 VITALS — BP 138/76 | HR 119 | Resp 18 | Ht 67.0 in | Wt 213.0 lb

## 2012-11-04 DIAGNOSIS — Z0000 Encounter for general adult medical examination without abnormal findings: Secondary | ICD-10-CM

## 2012-11-04 DIAGNOSIS — R0789 Other chest pain: Secondary | ICD-10-CM

## 2012-11-04 DIAGNOSIS — N400 Enlarged prostate without lower urinary tract symptoms: Secondary | ICD-10-CM

## 2012-11-04 DIAGNOSIS — E119 Type 2 diabetes mellitus without complications: Secondary | ICD-10-CM

## 2012-11-04 DIAGNOSIS — Z23 Encounter for immunization: Secondary | ICD-10-CM

## 2012-11-04 DIAGNOSIS — Z125 Encounter for screening for malignant neoplasm of prostate: Secondary | ICD-10-CM

## 2012-11-04 LAB — POCT UA - MICROALBUMIN
Albumin/Creatinine Ratio, Urine, POC: <30
Creatinine, POC: 200 mg/dL

## 2012-11-04 MED ORDER — TERAZOSIN HCL 1 MG PO CAPS: 1.0000 mg | ORAL_CAPSULE | Freq: Every day | ORAL | Status: DC

## 2012-11-04 NOTE — Progress Notes (Signed)
Subjective:    Patient ID: Steven Estrada, male    DOB: 1956-10-04, 56 y.o.   MRN: [id number]  HPI He is here for complete physical today. His only complaint is some occasional chest pain. He said the only time it bothers him is when he is on the treadmill working out. He will notice some discomfort in the center of his upper chest. He thinks it may just be more of a musculoskeletal type strain to the doesn't bother him otherwise. He says if he stops working out it immediately disappears and restart as soon as he starts working out again. He says he just pushes through it for a couple minutes it actually seems to go away with continued exercise. He denies any diaphoresis, shortness of breath, radiation of this discomfort into the arms or neck. He denies any prior history of heart disease. No significant family history of heart disease. He is diabetic and has a history of high blood pressure that he's been well controlled for both diseases.  Still waking up 2x at night to pee. Says the flomax didn't work well. No side effects he just felt it was not helpful.  DM- no cuts ore sores that aren't healing well. No low. Taking meds regualarly. No hypoglycemic events. He works out regularly. He is only taking metformin once a day.   Review of Systems  BP 138/76  Pulse 119  Resp 18  Ht 5\' 7"  (1.702 m)  Wt 213 lb (96.616 kg)  BMI 33.36 kg/m2  SpO2 96%    No Known Allergies  Past Medical History  Diagnosis Date  . Hyperlipidemia   . Hypertension     Past Surgical History  Procedure Date  . No past surgeries     History   Social History  . Marital Status: Divorced    Spouse Name: N/A    Number of Children: 1  . Years of Education: N/A   Occupational History  . TRUCK DRIVER    Social History Main Topics  . Smoking status: Never Smoker   . Smokeless tobacco: Never Used  . Alcohol Use: Yes     Comment: 12 per month  . Drug Use: No  . Sexually Active:    Other Topics Concern  .  Not on file   Social History Narrative   Works out [employer].     Family History  Problem Relation Age of Onset  . Hypertension Sister   . Hypertension Brother   . Hypertension Sister   . Heart failure Mother 38    Outpatient Encounter Prescriptions as of 11/04/2012  Medication Sig Dispense Refill  . metFORMIN (GLUCOPHAGE) 500 MG tablet Take 500 mg by mouth daily with breakfast.      . NON FORMULARY Glucometer, strips, and lancets to check dailly       . Olmesartan-Amlodipine-HCTZ (TRIBENZOR) 40-10-25 MG TABS Take 1 tablet by mouth daily.  30 tablet  11  . Pitavastatin Calcium (LIVALO) 4 MG TABS Take 1 tablet (4 mg total) by mouth at bedtime.  30 tablet  11  . tadalafil (CIALIS) 20 MG tablet Take 20 mg by mouth daily as needed.        . [DISCONTINUED] metFORMIN (GLUCOPHAGE) 500 MG tablet Take 1 tablet (500 mg total) by mouth 2 (two) times daily with a meal.  60 tablet  3          Objective:   Physical Exam  Constitutional: He is oriented to person, place, and time. He  appears well-developed and well-nourished.  HENT:  Head: Normocephalic and atraumatic.  Right Ear: External ear normal.  Left Ear: External ear normal.  Nose: Nose normal.  Mouth/Throat: Oropharynx is clear and moist.  Eyes: Conjunctivae normal and EOM are normal. Pupils are equal, round, and reactive to light.  Neck: Normal range of motion. Neck supple. No thyromegaly present.  Cardiovascular: Normal rate, regular rhythm, normal heart sounds and intact distal pulses.   Pulmonary/Chest: Effort normal and breath sounds normal.  Abdominal: Soft. Bowel sounds are normal. He exhibits no distension and no mass. There is no tenderness. There is no rebound and no guarding.  Musculoskeletal: Normal range of motion.  Lymphadenopathy:    He has no cervical adenopathy.  Neurological: He is alert and oriented to person, place, and time. He has normal reflexes.  Skin: Skin is warm and dry.  Psychiatric: He has a normal  mood and affect. His behavior is normal. Judgment and thought content normal.          Assessment & Plan:  CPE-overall think is doing well there. His vaccines are up-to-date. We will check a PSA. Colonoscopy is up-to-date.. Lab work is otherwise up-to-date. Recheck triglycerides.  Diabetes-very well controlled. Continue current regimen. Followup in 3-4 months.  Hypertension-well-controlled. Continue current regimen. Next  Benign prostatic hypertrophy-he says he didn't do well with the Flomax we will try Hytrin. We can discuss further his followup in 3 months to see if it's working well and titrate his dose as needed.  Atypical chest pain-EKG shows 80 beats per minute, normal sinus rhythm. It does look like he has inverted T waves in lead 3, aVF and possibly in lead 2. No significant ST or T wave elevation or abnormality. He also has T wave inversion in V6. Suspect possible old inferior infarct. We will schedule for a treadmill stress test. This will be able to recreate his symptoms and we can see if there's any evidence of ischemia.

## 2012-11-04 NOTE — Patient Instructions (Addendum)
Start 81mg  Aspirin daily. Enteric coated. One a day.

## 2012-11-05 LAB — PSA, TOTAL AND FREE
PSA, Free: 0.16 ng/mL
PSA: 0.51 ng/mL (ref <=4.00)

## 2012-11-05 LAB — TRIGLYCERIDES: Triglycerides: 177 mg/dL — ABNORMAL HIGH (ref <150)

## 2012-11-07 ENCOUNTER — Encounter: Payer: Self-pay | Admitting: *Deleted

## 2012-11-11 ENCOUNTER — Other Ambulatory Visit: Payer: Self-pay

## 2012-11-11 DIAGNOSIS — I10 Essential (primary) hypertension: Secondary | ICD-10-CM

## 2012-11-11 DIAGNOSIS — E785 Hyperlipidemia, unspecified: Secondary | ICD-10-CM

## 2012-11-11 MED ORDER — OLMESARTAN-AMLODIPINE-HCTZ 40-10-25 MG PO TABS: 1.0000 | ORAL_TABLET | Freq: Every day | ORAL | Status: DC

## 2012-11-11 MED ORDER — PITAVASTATIN CALCIUM 4 MG PO TABS: 4.0000 mg | ORAL_TABLET | Freq: Every day | ORAL | Status: DC

## 2012-11-14 ENCOUNTER — Encounter: Payer: BC Managed Care – PPO | Admitting: Nurse Practitioner

## 2012-12-02 ENCOUNTER — Encounter: Payer: BC Managed Care – PPO | Admitting: Physician Assistant

## 2012-12-30 ENCOUNTER — Ambulatory Visit (INDEPENDENT_AMBULATORY_CARE_PROVIDER_SITE_OTHER): Payer: Self-pay | Admitting: Physician Assistant

## 2012-12-30 DIAGNOSIS — R0789 Other chest pain: Secondary | ICD-10-CM

## 2012-12-30 DIAGNOSIS — R9439 Abnormal result of other cardiovascular function study: Secondary | ICD-10-CM

## 2012-12-30 NOTE — Patient Instructions (Addendum)
Your physician has requested that you have en exercise stress myoview. For further information please visit [email or web address]. Please follow instruction sheet, as given.   

## 2012-12-30 NOTE — Procedures (Signed)
Steven Estrada is a 56 y.o. male referred by PCP for ETT. nohxof CAD. PMH significant for DM2, HTN.  Non-smoker.  No FHx of CAD.  He is a Naval architect.  Notes exertional chest pain over the last several mos. No associated symptoms. No dyspnea. No syncope.  Exam unremarkable.  ECG with inf Q waves.  Exercise Treadmill Test  Pre-Exercise Testing Evaluation Rhythm: normal sinus  Rate: 107     Test  Exercise Tolerance Test Ordering [id number] Santina Evans MD  Interpreting MD: Tereso Newcomer PA-C  Unique Test No: 1  Treadmill:  1  Indication for ETT: chest pain - rule out ischemia  Contraindication to ETT: No   Stress Modality: exercise - treadmill  Cardiac Imaging Performed: non   Protocol: standard Bruce - maximal  Max BP:  172/98  Max MPHR (bpm): 166  85% MPR (bpm):  141  MPHR obtained (bpm):  162 % MPHR obtained:  98%  Reached 85% MPHR (min:sec):  3:35 Total Exercise Time (min-sec):  7:01  Workload in METS:  8.4 Borg Scale: 12  Reason ETT Terminated:  patient's desire to stop    ST Segment Analysis At Rest: non-specific ST segment slurring With Exercise: borderline ST changes  Other Information Arrhythmia:  No Angina during ETT:  absent (0) Quality of ETT:  indeterminate  ETT Interpretation:  borderline (indeterminate) with non-specific ST changes  Comments: Fair exercise tolerance. No chest pain. Normal BP response to exercise. There were borderline ST changes in the lateral leads at peak activity.  This was worse in recovery and involved the inferolateral leads.  Cannot rule out ischemia.   Recommendations: Patient will be scheduled for an ETT-Myoview. SignedTereso Newcomer, PA-C  10:01 AM 12/30/2012

## 2013-01-13 ENCOUNTER — Ambulatory Visit (HOSPITAL_COMMUNITY): Payer: BC Managed Care – PPO | Attending: Internal Medicine | Admitting: Radiology

## 2013-01-13 VITALS — BP 119/83 | HR 90 | Ht 68.0 in | Wt 213.0 lb

## 2013-01-13 DIAGNOSIS — R079 Chest pain, unspecified: Secondary | ICD-10-CM

## 2013-01-13 DIAGNOSIS — R0789 Other chest pain: Secondary | ICD-10-CM

## 2013-01-13 DIAGNOSIS — R9431 Abnormal electrocardiogram [ECG] [EKG]: Secondary | ICD-10-CM | POA: Insufficient documentation

## 2013-01-13 DIAGNOSIS — R9439 Abnormal result of other cardiovascular function study: Secondary | ICD-10-CM

## 2013-01-13 MED ORDER — TECHNETIUM TC 99M SESTAMIBI GENERIC - CARDIOLITE
30.0000 | Freq: Once | INTRAVENOUS | Status: AC | PRN
  Administered 2013-01-13: 30 via INTRAVENOUS

## 2013-01-13 MED ORDER — TECHNETIUM TC 99M SESTAMIBI GENERIC - CARDIOLITE
10.0000 | Freq: Once | INTRAVENOUS | Status: AC | PRN
  Administered 2013-01-13: 10 via INTRAVENOUS

## 2013-01-13 NOTE — Progress Notes (Signed)
  [facility] SITE 3 NUCLEAR MED [address] [place], [place] [address] [phone]    Cardiology Nuclear Med Study  Steven Estrada is a 57 y.o. male     MRN : [id number]     DOB: [birth year]  Procedure Date: 01/13/2013  Nuclear Med Background Indication for Stress Test:  Evaluation for Ischemia and Abnormal GXT History:  12/30/12 [id number] ST changes Cardiac Risk Factors: Hypertension, Lipids, NIDDM and Obesity  Symptoms:  Chest Pain with Exertion (last date of chest discomfort was about 3-4 weeks ago)   Nuclear Pre-Procedure Caffeine/Decaff Intake:  None NPO After: 8:00pm   Lungs:  Clear. O2 Sat: 96% on room air. IV 0.9% NS with Angio Cath:  20g  IV Site: R Hand  IV Started by:  Cathlyn Parsons, RN  Chest Size (in):  44 Cup Size: n/a  Height: 5\' 8"  (1.727 m)  Weight:  213 lb (96.616 kg)  BMI:  Body mass index is 32.39 kg/(m^2). Tech Comments:  n/a    Nuclear Med Study 1 or 2 day study: 1 day  Stress Test Type:  Stress  Reading MD: Dietrich Pates, MD  Order Authorizing Rivers Hamrick:  Nani Gasser, MD and Tereso Newcomer, PA-C  Resting Radionuclide: Technetium 20m Sestamibi  Resting Radionuclide Dose: 11.0 mCi   Stress Radionuclide:  Technetium 41m Sestamibi  Stress Radionuclide Dose: 33.0 mCi           Stress Protocol Rest HR: 90 Stress HR: 164  Rest BP: 119/83 Stress BP: 165/85  Exercise Time (min): 9:00 METS: 10.1   Predicted Max HR: 166 bpm % Max HR: 98.8 bpm Rate Pressure Product: 29528   Dose of Adenosine (mg):  n/a Dose of Lexiscan: n/a mg  Dose of Atropine (mg): n/a Dose of Dobutamine: n/a mcg/kg/min (at max HR)  Stress Test Technologist: Smiley Houseman, CMA-N  Nuclear Technologist:  Domenic Polite, CNMT     Rest Procedure:  Myocardial perfusion imaging was performed at rest 45 minutes following the intravenous administration of Technetium 32m Sestamibi.  Rest ECG: NSR.    Stress Procedure:  The patient exercised on the treadmill  utilizing the Bruce Protocol for nine minutes. The patient stopped due to fatigue and denied any chest pain.  Technetium 66m Sestamibi was injected at peak exercise and myocardial perfusion imaging was performed after a brief delay.  Stress ECG: In recovery period ST depression in leads II, III (1 mm) and 1 mm in V5/V6.  Incompletely normalized by end of recovery.  QPS Raw Data Images: Images were motion corrected.  SOft tissue (diaphragm, bowel activity) underlie heart. Stress Images: Small to medium defect in the inferior wall (base, mid).  Otherwise normal perfusion.   Rest Images:  Near normalization of inferior wall.  Subtraction (SDS):  These findings are consistent with ischemia. Transient Ischemic Dilatation (Normal <1.22):  0.99 Lung/Heart Ratio (Normal <0.45):  0.29  Quantitative Gated Spect Images QGS EDV:  94 ml QGS ESV:  41 ml  Impression Exercise Capacity:  Good exercise capacity. BP Response:  Normal blood pressure response. Clinical Symptoms:  No chest pain. ECG Impression: ST changes consistent with ischemia . See above. Comparison with Prior Nuclear Study: No previous nuclear study performed  Overall Impression:  Myoview scan with evidence of mild/moderate ischemia in inferior wall (base, mid)  LV Ejection Fraction: 56%.  LV Wall Motion:  NL LV Function; NL Wall Motion  Dietrich Pates

## 2013-01-14 ENCOUNTER — Encounter: Payer: Self-pay | Admitting: Physician Assistant

## 2013-01-15 ENCOUNTER — Telehealth: Payer: Self-pay | Admitting: Family Medicine

## 2013-01-15 ENCOUNTER — Telehealth: Payer: Self-pay | Admitting: *Deleted

## 2013-01-15 NOTE — Telephone Encounter (Signed)
Message copied by Tarri Fuller on Wed Jan 15, 2013  4:52 PM ------      Message from: [place], [place] T      Created: Tue Jan 14, 2013 10:10 AM       Stress test is abnormal with inferior ischemia.        He needs an appointment with one of our cardiologists for evaluation.  He was referred by PCP for ETT and has never seen one of our cardiologists.      He is a new patient and has to see the doctor (not the PA/NP).      Please notify METHENEY,CATHERINE, MD that patient will be set up for consultation.      Patient needs appointment this week.      Tereso Newcomer, PA-C  10:10 AM 01/14/2013 ------

## 2013-01-15 NOTE — Telephone Encounter (Signed)
Patient had abnormal stress test and Netta Cedars, PA referred him for a consult with Dr. Patty Sermons at [facility].  His appt is 01/17/13.  Scott just wanted you to know since you sent him for this test.

## 2013-01-15 NOTE — Telephone Encounter (Signed)
lmptcb about stress test results, pt has new pt appt w/[facility] 01/17/13 for abn myoview. I called Dr. Loman Chroman to let he know that we are having pt see cardio

## 2013-01-16 ENCOUNTER — Telehealth: Payer: Self-pay | Admitting: Physician Assistant

## 2013-01-16 NOTE — Telephone Encounter (Signed)
cb pt to advise of abnormal myoview results, pt moved his new pt appt with [facility] to 3/17. I advised pt to make sure he keeps this appt with Dr. Brackbill in order for dr. to discuss myoview 

## 2013-01-16 NOTE — Telephone Encounter (Signed)
Pt calling again, pls call with results

## 2013-01-16 NOTE — Telephone Encounter (Signed)
New Problem: ° ° ° °Patient returned your call.  Please call back. °

## 2013-01-16 NOTE — Telephone Encounter (Signed)
cb pt to advise of abnormal myoview results, pt moved his new pt appt with [facility] to 3/17. I advised pt to make sure he keeps this appt with Dr. Patty Sermons in order for dr. to discuss [facility]

## 2013-01-17 ENCOUNTER — Ambulatory Visit: Payer: BC Managed Care – PPO | Admitting: Cardiology

## 2013-01-20 ENCOUNTER — Ambulatory Visit (INDEPENDENT_AMBULATORY_CARE_PROVIDER_SITE_OTHER): Payer: BC Managed Care – PPO | Admitting: Cardiology

## 2013-01-20 ENCOUNTER — Encounter: Payer: Self-pay | Admitting: Cardiology

## 2013-01-20 ENCOUNTER — Encounter: Payer: Self-pay | Admitting: *Deleted

## 2013-01-20 ENCOUNTER — Encounter (HOSPITAL_COMMUNITY): Payer: Self-pay | Admitting: Pharmacy Technician

## 2013-01-20 VITALS — BP 137/87 | HR 116 | Ht 68.0 in | Wt 214.8 lb

## 2013-01-20 DIAGNOSIS — R079 Chest pain, unspecified: Secondary | ICD-10-CM

## 2013-01-20 DIAGNOSIS — Z01818 Encounter for other preprocedural examination: Secondary | ICD-10-CM

## 2013-01-20 NOTE — Progress Notes (Signed)
Steven Estrada Date of Birth:  07/19/1956 [facility] [address] [place], [place]  [address] [phone]         Fax   [phone]  History of Present Illness: This pleasant 57 year old [place] American male is seen by me for the first time today.  He comes in for discussion of his recent Myoview stress test which was abnormal.  He gives a history of having had some exertional chest tightness when working out several months ago.  He had a treadmill stress test which was abnormal with borderline ST changes on 12/30/12.  That reason he returned to the office on 01/13/13 for a treadmill Myoview stress test.  The Myoview showed normal left ventricular systolic function with an ejection fraction of 56% but did show evidence of mild to moderate ischemia of the inferior wall involving the base and mid portions of the inferior wall.  The patient has had no recent chest tightness because he has decreased the intensity of his exercise work-outs.  The patient has multiple risk factors for ischemic heart disease.  He has a history of hypercholesterolemia.  He has diabetes and he has history of essential hypertension. His family history reveals that his mother had congestive heart failure and died at age 43.  His father died of old age but not of heart problems. 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  Current Outpatient Prescriptions  Medication Sig Dispense Refill  . aspirin 81 MG tablet Take 81 mg by mouth daily.      . fish oil-omega-3 fatty acids 1000 MG capsule Take 1 g by mouth daily.      . metFORMIN (GLUCOPHAGE) 500 MG tablet Take 500 mg by mouth daily with breakfast.      . NON FORMULARY Glucometer, strips, and lancets to check dailly       . Olmesartan-Amlodipine-HCTZ (TRIBENZOR) 40-10-25 MG TABS Take 1 tablet by mouth daily.  30 tablet  5  . Pitavastatin Calcium (LIVALO) 4 MG TABS Take 1 tablet (4 mg total) by mouth at bedtime.  30  tablet  5  . tadalafil (CIALIS) 20 MG tablet Take 20 mg by mouth daily as needed for erectile dysfunction.       Marland Kitchen terazosin (HYTRIN) 1 MG capsule Take 1 capsule (1 mg total) by mouth at bedtime.  30 capsule  2   No current facility-administered medications for this visit.    No Known Allergies  Patient Active Problem List  Diagnosis  . Type II or unspecified type diabetes mellitus without mention of complication, not stated as uncontrolled  . HYPERLIPIDEMIA  . HYPERTENSION, BENIGN  . HEMATURIA UNSPECIFIED  . HYPERTROPHY PROSTATE W/O UR OBST & OTH LUTS  . DERMATITIS, SEBORRHEIC  . NOCTURIA  . Chest pain    History  Smoking status  . Never Smoker   Smokeless tobacco  . Never Used    History  Alcohol Use  . Yes    Comment: 12 per month    Family History  Problem Relation Age of Onset  . Hypertension Sister   . Hypertension Brother   . Hypertension Sister   . Heart failure Mother 18    Review of Systems: Constitutional: no fever chills diaphoresis or fatigue or change in weight.  Head and neck: no hearing loss, no epistaxis, no photophobia or visual disturbance. Respiratory: No cough, shortness of breath or wheezing. Cardiovascular: No  peripheral edema, palpitations.  Positive for  exertional chest pain with vigorous exercise Gastrointestinal: No abdominal distention, no abdominal pain, no change in bowel habits hematochezia or melena. Genitourinary: No dysuria, no frequency, no urgency, no nocturia. Musculoskeletal:No arthralgias, no back pain, no gait disturbance or myalgias. Neurological: No dizziness, no headaches, no numbness, no seizures, no syncope, no weakness, no tremors. Hematologic: No lymphadenopathy, no easy bruising. Psychiatric: No confusion, no hallucinations, no sleep disturbance.    Physical Exam: Filed Vitals:   01/20/13 1059  BP: 137/87  Pulse: 116   the general appearance reveals a well-developed well-nourished middle-aged gentleman in no  distress.The head and neck exam reveals pupils equal and reactive.  Extraocular movements are full.  There is no scleral icterus.  The mouth and pharynx are normal.  The neck is supple.  The carotids reveal no bruits.  The jugular venous pressure is normal.  The  thyroid is not enlarged.  There is no lymphadenopathy.  The chest is clear to percussion and auscultation.  There are no rales or rhonchi.  Expansion of the chest is symmetrical.  The precordium is quiet.  The first heart sound is normal.  The second heart sound is physiologically split.  There is no murmur gallop rub or click.  There is no abnormal lift or heave.  The abdomen is soft and nontender.  The bowel sounds are normal.  The liver and spleen are not enlarged.  There are no abdominal masses.  There are no abdominal bruits.  Extremities reveal good pedal pulses.  There is no phlebitis or edema.  There is no cyanosis or clubbing.  Strength is normal and symmetrical in all extremities.  There is no lateralizing weakness.  There are no sensory deficits.  The skin is warm and dry.  There is no rash.     Assessment / Plan: My impression is that this gentleman has a history consistent with exertional angina at high levels of effort.  His recent Myoview stress test showed reversible inferior wall ischemia of mild to moderate degree.  Ejection fraction was 56%  Recommendation is that he continue his same medication.  He has not been taking it daily 81 mg aspirin and we encouraged him to start doing that now.  We will arrange for diagnostic left heart cardiac catheterization with possible PCI.  This will be done in the [facility] catheter lab by Dr. [place].  The patient will come for preoperative lab work on 01/30/13.  He will not take his metformin for 2 days prior to catheter. The risks and benefits of a cardiac catheterization including, but not limited to, death, stroke, MI, kidney damage and bleeding were discussed with the patient who  indicates understanding and agrees to proceed.

## 2013-01-20 NOTE — Patient Instructions (Addendum)
Restart your Aspirin 81 mg daily  Your physician has requested that you have a cardiac catheterization. Cardiac catheterization is used to diagnose and/or treat various heart conditions. Doctors may recommend this procedure for a number of different reasons. The most common reason is to evaluate chest pain. Chest pain can be a symptom of coronary artery disease (CAD), and cardiac catheterization can show whether plaque is narrowing or blocking your heart's arteries. This procedure is also used to evaluate the valves, as well as measure the blood flow and oxygen levels in different parts of your heart. For further information please visit [email or web address] Please follow instruction sheet, as given.  WILL TRY TO GET THIS ARRANGED NEXT Friday 3/28, WILL CALL YOU WITH THE DETAILS  WOULD LIKE FOR YOU TO RETURN ON 3/27 FOR PRECATH LABS AND YOUR INSTRUCTION LETTER

## 2013-01-30 ENCOUNTER — Ambulatory Visit (INDEPENDENT_AMBULATORY_CARE_PROVIDER_SITE_OTHER)
Admission: RE | Admit: 2013-01-30 | Discharge: 2013-01-30 | Disposition: A | Discharge status: Home / Self Care | Payer: BC Managed Care – PPO | Source: Ambulatory Visit | Attending: Cardiology | Admitting: Cardiology

## 2013-01-30 ENCOUNTER — Other Ambulatory Visit (INDEPENDENT_AMBULATORY_CARE_PROVIDER_SITE_OTHER): Payer: BC Managed Care – PPO

## 2013-01-30 DIAGNOSIS — R079 Chest pain, unspecified: Secondary | ICD-10-CM

## 2013-01-30 DIAGNOSIS — Z01818 Encounter for other preprocedural examination: Secondary | ICD-10-CM

## 2013-01-30 LAB — BASIC METABOLIC PANEL
BUN: 15 mg/dL (ref 6–23)
Chloride: 100 mEq/L (ref 96–112)
Creatinine, Ser: 1 mg/dL (ref 0.4–1.5)
GFR: 103.4 mL/min (ref >60.00)

## 2013-01-30 LAB — CBC WITH DIFFERENTIAL/PLATELET
Basophils Absolute: 0 10*3/uL (ref 0.0–0.1)
Eosinophils Relative: 1 % (ref 0.0–5.0)
MCV: 80.5 fl (ref 78.0–100.0)
Monocytes Absolute: 0.3 10*3/uL (ref 0.1–1.0)
Neutrophils Relative %: 57.4 % (ref 43.0–77.0)
Platelets: 278 10*3/uL (ref 150.0–400.0)
WBC: 6.4 10*3/uL (ref 4.5–10.5)

## 2013-01-30 LAB — PROTIME-INR
INR: 1.2 ratio — ABNORMAL HIGH (ref 0.8–1.0)
Prothrombin Time: 12.3 s (ref 10.2–12.4)

## 2013-01-31 ENCOUNTER — Encounter (HOSPITAL_COMMUNITY)
Admission: RE | Disposition: A | Discharge status: Home / Self Care | Payer: Self-pay | Source: Ambulatory Visit | Attending: Cardiology

## 2013-01-31 ENCOUNTER — Ambulatory Visit (HOSPITAL_COMMUNITY)
Admission: RE | Admit: 2013-01-31 | Discharge: 2013-01-31 | Disposition: A | Discharge status: Home / Self Care | Payer: BC Managed Care – PPO | Source: Ambulatory Visit | Attending: Cardiology | Admitting: Cardiology

## 2013-01-31 ENCOUNTER — Telehealth: Payer: Self-pay | Admitting: Cardiology

## 2013-01-31 DIAGNOSIS — E119 Type 2 diabetes mellitus without complications: Secondary | ICD-10-CM | POA: Insufficient documentation

## 2013-01-31 DIAGNOSIS — R079 Chest pain, unspecified: Secondary | ICD-10-CM

## 2013-01-31 DIAGNOSIS — I2510 Atherosclerotic heart disease of native coronary artery without angina pectoris: Secondary | ICD-10-CM | POA: Insufficient documentation

## 2013-01-31 DIAGNOSIS — E7800 Pure hypercholesterolemia, unspecified: Secondary | ICD-10-CM | POA: Insufficient documentation

## 2013-01-31 DIAGNOSIS — E785 Hyperlipidemia, unspecified: Secondary | ICD-10-CM | POA: Insufficient documentation

## 2013-01-31 DIAGNOSIS — Z01818 Encounter for other preprocedural examination: Secondary | ICD-10-CM

## 2013-01-31 DIAGNOSIS — I10 Essential (primary) hypertension: Secondary | ICD-10-CM | POA: Insufficient documentation

## 2013-01-31 DIAGNOSIS — R9439 Abnormal result of other cardiovascular function study: Secondary | ICD-10-CM | POA: Insufficient documentation

## 2013-01-31 HISTORY — PX: LEFT HEART CATHETERIZATION WITH CORONARY ANGIOGRAM: SHX5451

## 2013-01-31 LAB — GLUCOSE, CAPILLARY: Glucose-Capillary: 137 mg/dL — ABNORMAL HIGH (ref 70–99)

## 2013-01-31 LAB — BASIC METABOLIC PANEL
Calcium: 9.3 mg/dL (ref 8.4–10.5)
Chloride: 101 mEq/L (ref 96–112)
Creatinine, Ser: 1.01 mg/dL (ref 0.50–1.35)
GFR calc Af Amer: >90 mL/min (ref >90)
GFR calc non Af Amer: 82 mL/min — ABNORMAL LOW (ref >90)

## 2013-01-31 SURGERY — LEFT HEART CATHETERIZATION WITH CORONARY ANGIOGRAM
Anesthesia: LOCAL

## 2013-01-31 MED ORDER — DIAZEPAM 5 MG PO TABS
5.0000 mg | ORAL_TABLET | ORAL | Status: AC
  Administered 2013-01-31: 5 mg via ORAL

## 2013-01-31 MED ORDER — HEPARIN SODIUM (PORCINE) 1000 UNIT/ML IJ SOLN
INTRAMUSCULAR | Status: AC
  Filled 2013-01-31: qty 1

## 2013-01-31 MED ORDER — SODIUM CHLORIDE 0.9 % IV SOLN: 250.0000 mL | INTRAVENOUS | Status: DC | PRN

## 2013-01-31 MED ORDER — MIDAZOLAM HCL 2 MG/2ML IJ SOLN
INTRAMUSCULAR | Status: AC
  Filled 2013-01-31: qty 2

## 2013-01-31 MED ORDER — VERAPAMIL HCL 2.5 MG/ML IV SOLN
INTRAVENOUS | Status: AC
  Filled 2013-01-31: qty 2

## 2013-01-31 MED ORDER — FENTANYL CITRATE 0.05 MG/ML IJ SOLN
INTRAMUSCULAR | Status: AC
Start: 2013-01-31 — End: 2013-01-31
  Filled 2013-01-31: qty 2

## 2013-01-31 MED ORDER — ASPIRIN 81 MG PO CHEW
CHEWABLE_TABLET | ORAL | Status: AC
  Filled 2013-01-31: qty 4

## 2013-01-31 MED ORDER — DIAZEPAM 5 MG PO TABS
ORAL_TABLET | ORAL | Status: AC
  Filled 2013-01-31: qty 1

## 2013-01-31 MED ORDER — LIDOCAINE HCL (PF) 1 % IJ SOLN
INTRAMUSCULAR | Status: AC
  Filled 2013-01-31: qty 30

## 2013-01-31 MED ORDER — ASPIRIN 81 MG PO CHEW
324.0000 mg | CHEWABLE_TABLET | ORAL | Status: AC
  Administered 2013-01-31: 324 mg via ORAL

## 2013-01-31 MED ORDER — HEPARIN (PORCINE) IN NACL 2-0.9 UNIT/ML-% IJ SOLN
INTRAMUSCULAR | Status: AC
  Filled 2013-01-31: qty 1000

## 2013-01-31 MED ORDER — SODIUM CHLORIDE 0.9 % IJ SOLN: 3.0000 mL | INTRAMUSCULAR | Status: DC | PRN

## 2013-01-31 MED ORDER — SODIUM CHLORIDE 0.9 % IJ SOLN: 3.0000 mL | Freq: Two times a day (BID) | INTRAMUSCULAR | Status: DC

## 2013-01-31 MED ORDER — POTASSIUM CHLORIDE CRYS ER 20 MEQ PO TBCR
40.0000 meq | EXTENDED_RELEASE_TABLET | Freq: Once | ORAL | Status: AC
  Administered 2013-01-31: 40 meq via ORAL
  Filled 2013-01-31: qty 2

## 2013-01-31 MED ORDER — SODIUM CHLORIDE 0.9 % IV SOLN
INTRAVENOUS | Status: DC
  Administered 2013-01-31: 75 mL/h via INTRAVENOUS
  Administered 2013-01-31: 06:00:00 via INTRAVENOUS

## 2013-01-31 NOTE — Telephone Encounter (Signed)
New Prob    Wanting to confirm prescription that recently came in for motorprolol. Would like to speak to nurse.

## 2013-01-31 NOTE — Interval H&P Note (Signed)
History and Physical Interval Note:  01/31/2013 7:58 AM  Steven Estrada  has presented today for surgery, with the diagnosis of cad  The various methods of treatment have been discussed with the patient and family. After consideration of risks, benefits and other options for treatment, the patient has consented to  Procedure(s): LEFT HEART CATHETERIZATION WITH CORONARY ANGIOGRAM (N/A) as a surgical intervention .  The patient's history has been reviewed, patient examined, no change in status, stable for surgery.  I have reviewed the patient's chart and labs.  Questions were answered to the patient's satisfaction.     Theron Arista [facility] 01/31/2013 7:58 AM

## 2013-01-31 NOTE — CV Procedure (Signed)
   Cardiac Catheterization Procedure Note  Name: Steven Estrada MRN: [id number] DOB: [birth year]  Procedure: Left Heart Cath, Selective Coronary Angiography, LV angiography  Indication: 57 year old black male with history of hypertension, hyperlipidemia, and diabetes mellitus presents with exertional angina class II. Stress Myoview study shows evidence of inferior wall ischemia.   Procedural Details: The right wrist was prepped, draped, and anesthetized with 1% lidocaine. Using the modified Seldinger technique, a 5 French sheath was introduced into the right radial artery. 3 mg of verapamil was administered through the sheath, weight-based unfractionated heparin was administered intravenously. Standard Judkins catheters were used for selective coronary angiography and left ventriculography. Catheter exchanges were performed over an exchange length guidewire. There were no immediate procedural complications. A TR band was used for radial hemostasis at the completion of the procedure.  The patient was transferred to the post catheterization recovery area for further monitoring.  Procedural Findings: Hemodynamics: AO 111/71 with a mean of 92 mmHg LV 111/16 mmHg  Coronary angiography: Coronary dominance: right  Left mainstem: There is 20-30% narrowing of the distal left main.  Left anterior descending (LAD): The left anterior descending artery is a large vessel that extends to the apex. It has mild disease in the proximal to mid vessel up to 20%. The distal vessel tapers and appears diffusely diseased without focal stenosis.  There is a large ramus intermediate branch which has mild irregularities less than 10%.  Left circumflex (LCx): The left circumflex is a moderate size vessel. It gives rise to 2 marginal branches distally. In the mid vessel just prior to the first marginal branch there is a 60% stenosis. Following the first marginal branch there is a 70% stenosis.  Right coronary artery  (RCA): The right coronary is a dominant vessel. It has severe diffuse disease in the mid vessel up to 95%. The distal vessel is occluded at the crux. There are left to right collaterals from the left coronary primarily through septal perforators.  Left ventriculography: Left ventricular systolic function is normal, LVEF is estimated at 55-65%, there is no significant mitral regurgitation   Final Conclusions:  1. 2 vessel obstructive coronary disease. There are moderate lesions in the mid to distal circumflex. The right coronary is chronically occluded with left to right collaterals. 2. Normal left ventricular function.  Recommendations: I recommend initial therapy with medication. We will add metoprolol to his current medications. If the patient continues to have refractory angina despite 2 antianginal agents he would be a candidate for percutaneous intervention with stenting of the circumflex and CTO PCI of the right coronary.  Theron Arista [facility] 01/31/2013, 8:31 AM

## 2013-01-31 NOTE — Telephone Encounter (Signed)
Returned call to pharmacist at [facility] she stated a PA already returned phone call and it was already taken care of.

## 2013-01-31 NOTE — H&P (View-Only) (Signed)
Steven Estrada Date of Birth:  07/12/1956 [facility] [address] [place], [place]  [address] [phone]         Fax   [phone]  History of Present Illness: This pleasant 56-year-old African American male is seen by me for the first time today.  He comes in for discussion of his recent Myoview stress test which was abnormal.  He gives a history of having had some exertional chest tightness when working out several months ago.  He had a treadmill stress test which was abnormal with borderline ST changes on 12/30/12.  That reason he returned to the office on 01/13/13 for a treadmill Myoview stress test.  The Myoview showed normal left ventricular systolic function with an ejection fraction of 56% but did show evidence of mild to moderate ischemia of the inferior wall involving the base and mid portions of the inferior wall.  The patient has had no recent chest tightness because he has decreased the intensity of his exercise work-outs.  The patient has multiple risk factors for ischemic heart disease.  He has a history of hypercholesterolemia.  He has diabetes and he has history of essential hypertension. His family history reveals that his mother had congestive heart failure and died at age 89.  His father died of old age but not of heart problems. 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  Current Outpatient Prescriptions  Medication Sig Dispense Refill  . aspirin 81 MG tablet Take 81 mg by mouth daily.      . fish oil-omega-3 fatty acids 1000 MG capsule Take 1 g by mouth daily.      . metFORMIN (GLUCOPHAGE) 500 MG tablet Take 500 mg by mouth daily with breakfast.      . NON FORMULARY Glucometer, strips, and lancets to check dailly       . Olmesartan-Amlodipine-HCTZ (TRIBENZOR) 40-10-25 MG TABS Take 1 tablet by mouth daily.  30 tablet  5  . Pitavastatin Calcium (LIVALO) 4 MG TABS Take 1 tablet (4 mg total) by mouth at bedtime.  30  tablet  5  . tadalafil (CIALIS) 20 MG tablet Take 20 mg by mouth daily as needed for erectile dysfunction.       . terazosin (HYTRIN) 1 MG capsule Take 1 capsule (1 mg total) by mouth at bedtime.  30 capsule  2   No current facility-administered medications for this visit.    No Known Allergies  Patient Active Problem List  Diagnosis  . Type II or unspecified type diabetes mellitus without mention of complication, not stated as uncontrolled  . HYPERLIPIDEMIA  . HYPERTENSION, BENIGN  . HEMATURIA UNSPECIFIED  . HYPERTROPHY PROSTATE W/O UR OBST & OTH LUTS  . DERMATITIS, SEBORRHEIC  . NOCTURIA  . Chest pain    History  Smoking status  . Never Smoker   Smokeless tobacco  . Never Used    History  Alcohol Use  . Yes    Comment: 12 per month    Family History  Problem Relation Age of Onset  . Hypertension Sister   . Hypertension Brother   . Hypertension Sister   . Heart failure Mother 80    Review of Systems: Constitutional: no fever chills diaphoresis or fatigue or change in weight.  Head and neck: no hearing loss, no epistaxis, no photophobia or visual disturbance. Respiratory: No cough, shortness of breath or wheezing. Cardiovascular: No  peripheral edema, palpitations.  Positive for   exertional chest pain with vigorous exercise Gastrointestinal: No abdominal distention, no abdominal pain, no change in bowel habits hematochezia or melena. Genitourinary: No dysuria, no frequency, no urgency, no nocturia. Musculoskeletal:No arthralgias, no back pain, no gait disturbance or myalgias. Neurological: No dizziness, no headaches, no numbness, no seizures, no syncope, no weakness, no tremors. Hematologic: No lymphadenopathy, no easy bruising. Psychiatric: No confusion, no hallucinations, no sleep disturbance.    Physical Exam: Filed Vitals:   01/20/13 1059  BP: 137/87  Pulse: 116   the general appearance reveals a well-developed well-nourished middle-aged gentleman in no  distress.The head and neck exam reveals pupils equal and reactive.  Extraocular movements are full.  There is no scleral icterus.  The mouth and pharynx are normal.  The neck is supple.  The carotids reveal no bruits.  The jugular venous pressure is normal.  The  thyroid is not enlarged.  There is no lymphadenopathy.  The chest is clear to percussion and auscultation.  There are no rales or rhonchi.  Expansion of the chest is symmetrical.  The precordium is quiet.  The first heart sound is normal.  The second heart sound is physiologically split.  There is no murmur gallop rub or click.  There is no abnormal lift or heave.  The abdomen is soft and nontender.  The bowel sounds are normal.  The liver and spleen are not enlarged.  There are no abdominal masses.  There are no abdominal bruits.  Extremities reveal good pedal pulses.  There is no phlebitis or edema.  There is no cyanosis or clubbing.  Strength is normal and symmetrical in all extremities.  There is no lateralizing weakness.  There are no sensory deficits.  The skin is warm and dry.  There is no rash.     Assessment / Plan: My impression is that this gentleman has a history consistent with exertional angina at high levels of effort.  His recent Myoview stress test showed reversible inferior wall ischemia of mild to moderate degree.  Ejection fraction was 56%  Recommendation is that he continue his same medication.  He has not been taking it daily 81 mg aspirin and we encouraged him to start doing that now.  We will arrange for diagnostic left heart cardiac catheterization with possible PCI.  This will be done in the [facility] catheter lab by Dr. Jordan.  The patient will come for preoperative lab work on 01/30/13.  He will not take his metformin for 2 days prior to catheter. The risks and benefits of a cardiac catheterization including, but not limited to, death, stroke, MI, kidney damage and bleeding were discussed with the patient who  indicates understanding and agrees to proceed.    

## 2013-02-03 ENCOUNTER — Encounter: Payer: Self-pay | Admitting: Family Medicine

## 2013-02-03 ENCOUNTER — Ambulatory Visit (INDEPENDENT_AMBULATORY_CARE_PROVIDER_SITE_OTHER): Payer: BC Managed Care – PPO | Admitting: Family Medicine

## 2013-02-03 VITALS — BP 108/63 | HR 75 | Wt 209.0 lb

## 2013-02-03 DIAGNOSIS — I10 Essential (primary) hypertension: Secondary | ICD-10-CM

## 2013-02-03 DIAGNOSIS — E119 Type 2 diabetes mellitus without complications: Secondary | ICD-10-CM

## 2013-02-03 DIAGNOSIS — I2510 Atherosclerotic heart disease of native coronary artery without angina pectoris: Secondary | ICD-10-CM | POA: Insufficient documentation

## 2013-02-03 LAB — POCT GLYCOSYLATED HEMOGLOBIN (HGB A1C): Hemoglobin A1C: 6.4

## 2013-02-03 MED ORDER — METFORMIN HCL 500 MG PO TABS: 500.0000 mg | ORAL_TABLET | Freq: Every day | ORAL | Status: DC

## 2013-02-03 NOTE — Progress Notes (Signed)
  Subjective:    Patient ID: Steven Estrada, male    DOB: 1956/03/27, 57 y.o.   MRN: [id number]  HPI DM - No hypoglycemic events. No cuts or sores or wounds that aren't healing well. He recently had a cardiac cath and was told he had 2 blockages. One was 99% but evidently had some collaterals. The second was about 60-90%. They did not do any interventions but decided to treat him medically. He is on a statin, aspirin and added metoprolol to his regimen. He's been tolerating it well without any side effects. He did hold his metformin since before the procedure and has not restarted it yet.  HTN -  Pt denies chest pain, SOB, dizziness, or heart palpitations.  Taking meds as directed w/o problems.  Denies medication side effects.     Review of Systems     Objective:   Physical Exam  Constitutional: He is oriented to person, place, and time. He appears well-developed and well-nourished.  HENT:  Head: Normocephalic and atraumatic.  Cardiovascular: Normal rate, regular rhythm and normal heart sounds.   Pulmonary/Chest: Effort normal and breath sounds normal.  Neurological: He is alert and oriented to person, place, and time.  Skin: Skin is warm and dry.  Psychiatric: He has a normal mood and affect. His behavior is normal.    I did look at his wound on his right wrist status post catheterization. It is healing well. No erythema, induration or soreness. We cleaned it well and placed a Band-Aid on it.      Assessment & Plan:  DM- preventative care UTD.  F/U in 3 months. Restart metformin ( had to hold for cath procedure).   Lab Results  Component Value Date   HGBA1C 6.4 02/03/2013   HTN - well controlled.   CAD- On statin, ASA, new betablocker.  Has follow up with cardiology in about 2 weeks. He wanted her to start exercising. I encouraged him to wait until he sees cardiologist in about 2 weeks. Some walking-type activity with low impact aerobic would probably be fine until he sees him  back. He is currently asymptomatic without any chest pain or discomfort.

## 2013-02-05 ENCOUNTER — Telehealth: Payer: Self-pay | Admitting: *Deleted

## 2013-02-05 NOTE — Telephone Encounter (Signed)
Advised patient of lab and xray results, patient already had cath

## 2013-02-05 NOTE — Telephone Encounter (Signed)
Message copied by Burnell Blanks on Wed Feb 05, 2013  5:57 PM ------      Message from: Cassell Clement      Created: Thu Jan 30, 2013  5:29 PM       Please report.  Pre-cath labs are okay except potassium is borderline low 3.4.  Eat some bananas or other high potassium foods tonight. ------

## 2013-02-05 NOTE — Telephone Encounter (Signed)
Message copied by Burnell Blanks on Wed Feb 05, 2013  5:57 PM ------      Message from: Cassell Clement      Created: Thu Jan 30, 2013  3:02 PM       Chest xray normal.  Please report. ------

## 2013-02-13 ENCOUNTER — Other Ambulatory Visit: Payer: Self-pay | Admitting: *Deleted

## 2013-02-13 DIAGNOSIS — I10 Essential (primary) hypertension: Secondary | ICD-10-CM

## 2013-02-13 DIAGNOSIS — E785 Hyperlipidemia, unspecified: Secondary | ICD-10-CM

## 2013-02-13 MED ORDER — OLMESARTAN-AMLODIPINE-HCTZ 40-10-25 MG PO TABS: 1.0000 | ORAL_TABLET | Freq: Every day | ORAL | Status: DC

## 2013-02-13 MED ORDER — PITAVASTATIN CALCIUM 4 MG PO TABS: 4.0000 mg | ORAL_TABLET | Freq: Every day | ORAL | Status: DC

## 2013-02-13 MED ORDER — METFORMIN HCL 500 MG PO TABS: 500.0000 mg | ORAL_TABLET | Freq: Every day | ORAL | Status: DC

## 2013-02-20 ENCOUNTER — Encounter: Payer: Self-pay | Admitting: Cardiology

## 2013-02-20 ENCOUNTER — Ambulatory Visit (INDEPENDENT_AMBULATORY_CARE_PROVIDER_SITE_OTHER): Payer: BC Managed Care – PPO | Admitting: Cardiology

## 2013-02-20 VITALS — BP 104/72 | HR 70 | Ht 68.0 in | Wt 210.4 lb

## 2013-02-20 DIAGNOSIS — I2510 Atherosclerotic heart disease of native coronary artery without angina pectoris: Secondary | ICD-10-CM

## 2013-02-20 DIAGNOSIS — I10 Essential (primary) hypertension: Secondary | ICD-10-CM

## 2013-02-20 DIAGNOSIS — E785 Hyperlipidemia, unspecified: Secondary | ICD-10-CM

## 2013-02-20 DIAGNOSIS — E119 Type 2 diabetes mellitus without complications: Secondary | ICD-10-CM

## 2013-02-20 MED ORDER — NITROGLYCERIN 0.4 MG SL SUBL: 0.4000 mg | SUBLINGUAL_TABLET | SUBLINGUAL | Status: DC | PRN

## 2013-02-20 NOTE — Patient Instructions (Signed)
Continue your current therapy and dietary changes.  Work into your exercise program focusing on aerobic activity and light weights for toning  I will see you in 6 weeks.

## 2013-02-20 NOTE — Progress Notes (Signed)
Salina April Date of Birth: 10-04-1956 Medical Record #[id number]  History of Present Illness: Mr. Stellmach is seen today for followup after recent cardiac catheterization. He is a 57 year old black male who has a history of diabetes, hypertension, and hyperlipidemia. He presented with class II angina. Stress Myoview study showed moderate ischemia of the inferior wall with normal ejection fraction of 56%. Cardiac catheterization demonstrated no significant disease in the left main, LAD, or intermediate vessels. Left circumflex had moderate mid vessel disease. The right coronary was occluded with left to right collaterals. We intensified his medical therapy with the addition of a beta blocker. He was already on amlodipine. He has felt well since discharge. He did note one episode of chest discomfort which he describes as indigestion. This occurred when he was walking from his truck the truck stop. It resolved with rest. He has not yet resumed his exercise program.  Current Outpatient Prescriptions on File Prior to Visit  Medication Sig Dispense Refill  . aspirin 81 MG tablet Take 81 mg by mouth daily.      . fish oil-omega-3 fatty acids 1000 MG capsule Take 1 g by mouth daily.      . metFORMIN (GLUCOPHAGE) 500 MG tablet Take 1 tablet (500 mg total) by mouth daily with breakfast.  90 tablet  1  . metoprolol tartrate (LOPRESSOR) 25 MG tablet Take 25 mg by mouth 2 (two) times daily.      . NON FORMULARY Glucometer, strips, and lancets to check dailly       . Olmesartan-Amlodipine-HCTZ (TRIBENZOR) 40-10-25 MG TABS Take 1 tablet by mouth daily.  90 tablet  1  . Pitavastatin Calcium (LIVALO) 4 MG TABS Take 1 tablet (4 mg total) by mouth at bedtime.  90 tablet  1  . tadalafil (CIALIS) 20 MG tablet Take 20 mg by mouth daily as needed for erectile dysfunction.        No current facility-administered medications on file prior to visit.    No Known Allergies  Past Medical History  Diagnosis Date  .  Hyperlipidemia   . Hypertension   . Hx of cardiovascular stress test     a. ETT-Myoview 3/14:  EF 56%, + inferior ischemia    Past Surgical History  Procedure Laterality Date  . No past surgeries      History  Smoking status  . Never Smoker   Smokeless tobacco  . Never Used    History  Alcohol Use  . Yes    Comment: 12 per month    Family History  Problem Relation Age of Onset  . Hypertension Sister   . Hypertension Brother   . Hypertension Sister   . Heart failure Mother 39    Review of Systems: As noted in history of present illness.  All other systems were reviewed and are negative.  Physical Exam: BP 104/72  Pulse 70  Ht 5\' 8"  (1.727 m)  Wt 210 lb 6.4 oz (95.437 kg)  BMI 32 kg/m2  SpO2 95% He is a pleasant black male in no acute distress. HEENT: Normal No JVD or bruits Lungs: Clear Cardiovascular: Regular rate and rhythm. Normal S1 and S2. No gallop or murmur. Abdomen: Soft and nontender. Obese. Extremities: No cyanosis or edema. No hematoma at cath site. Neuro: Nonfocal LABORATORY DATA:   Assessment / Plan: 1. Two-vessel obstructive coronary disease. He has chronic total occlusion of the right coronary with collaterals. He is on optimal medical therapy at this point with 2 antianginal  agents. I recommended he gradually resume his exercise program. We will followup again in 6 weeks. If he continues to have anginal symptoms despite good medical therapy he will be considered for percutaneous intervention of the left circumflex and the occluded RCA with CTO techniques.  2. Diabetes mellitus type 2.  3. Hypertension, controlled.  4. Combined hyperlipidemia. Patient has made significant dietary modifications. I've encouraged him in these efforts.

## 2013-02-25 ENCOUNTER — Telehealth: Payer: Self-pay | Admitting: Cardiology

## 2013-02-25 NOTE — Telephone Encounter (Signed)
Walk in pt Form " Critical illness Form" Pt Dropped off needs Completed Gave to Fernand Parkins 02/25/13/KM

## 2013-02-27 ENCOUNTER — Telehealth: Payer: Self-pay | Admitting: Cardiology

## 2013-02-27 NOTE — Telephone Encounter (Signed)
New problem   Pt calling to see if paperwork he dropped off is ready

## 2013-02-27 NOTE — Telephone Encounter (Signed)
New Problem:    Patient called in wanting to know if his forms had been filled out yet.  Please call back.

## 2013-02-27 NOTE — Telephone Encounter (Signed)
Patient called wanting to know if form completed.Patient was told received form this morning, form was given to person responsible for completing.Patient stated wanted to pick up when completed,will call back when ready.

## 2013-03-19 NOTE — Telephone Encounter (Signed)
Patient called no answer.LMTC. 

## 2013-03-26 NOTE — Telephone Encounter (Signed)
Returned call to patient he stated he spoke to someone in our office and his form was to be mailed to him.

## 2013-03-27 ENCOUNTER — Ambulatory Visit: Payer: BC Managed Care – PPO | Admitting: Cardiology

## 2013-04-04 ENCOUNTER — Ambulatory Visit: Payer: BC Managed Care – PPO | Admitting: Nurse Practitioner

## 2013-04-06 HISTORY — PX: CORONARY ANGIOPLASTY WITH STENT PLACEMENT: SHX49

## 2013-04-16 ENCOUNTER — Telehealth: Payer: Self-pay | Admitting: Cardiology

## 2013-04-16 NOTE — Telephone Encounter (Signed)
Critical Illness Form Dropped Off By Pt Completed & signed,  Spoke with Pt hes aware Ready For Pick up 04/16/13/KM

## 2013-04-17 ENCOUNTER — Telehealth: Payer: Self-pay | Admitting: Cardiology

## 2013-04-17 NOTE — Telephone Encounter (Signed)
Critical Illness Form picked Up By pt  04/17/13/KM

## 2013-04-21 ENCOUNTER — Ambulatory Visit: Payer: BC Managed Care – PPO | Admitting: Physician Assistant

## 2013-05-19 ENCOUNTER — Encounter: Payer: Self-pay | Admitting: Family Medicine

## 2013-05-19 ENCOUNTER — Ambulatory Visit (INDEPENDENT_AMBULATORY_CARE_PROVIDER_SITE_OTHER): Payer: BC Managed Care – PPO | Admitting: Family Medicine

## 2013-05-19 VITALS — BP 130/85 | HR 86 | Ht 68.0 in | Wt 207.0 lb

## 2013-05-19 DIAGNOSIS — I2510 Atherosclerotic heart disease of native coronary artery without angina pectoris: Secondary | ICD-10-CM

## 2013-05-19 DIAGNOSIS — E785 Hyperlipidemia, unspecified: Secondary | ICD-10-CM

## 2013-05-19 DIAGNOSIS — E119 Type 2 diabetes mellitus without complications: Secondary | ICD-10-CM

## 2013-05-19 DIAGNOSIS — I10 Essential (primary) hypertension: Secondary | ICD-10-CM

## 2013-05-19 NOTE — Progress Notes (Signed)
Subjective:    Patient ID: Steven Estrada, male    DOB: 05/03/1956, 57 y.o.   MRN: [id number]  HPI DM- Checking sugars 2-3 times per week. No hypoglycemic events. No wounds that aren't healing well.   Hyperlipidemia - He is now on 4 fish oils.    HTN-  Pt denies chest pain, SOB, dizziness, or heart palpitations.  Taking meds as directed w/o problems.  Denies medication side effects.  Had stent placed about 3 weeks ago and is working out now.   Review of Systems     BP 130/85  Pulse 86  Ht 5\' 8"  (1.727 m)  Wt 207 lb (93.895 kg)  BMI 31.48 kg/m2    No Known Allergies  Past Medical History  Diagnosis Date  . Hyperlipidemia   . Hypertension   . Hx of cardiovascular stress test     a. ETT-Myoview 3/14:  EF 56%, + inferior ischemia    Past Surgical History  Procedure Laterality Date  . Coronary angioplasty with stent placement  04/2013    Dr. Chales Abrahams at [place]    History   Social History  . Marital Status: Divorced    Spouse Name: N/A    Number of Children: 1  . Years of Education: N/A   Occupational History  . TRUCK DRIVER    Social History Main Topics  . Smoking status: Never Smoker   . Smokeless tobacco: Never Used  . Alcohol Use: Yes     Comment: 12 per month  . Drug Use: No  . Sexually Active:    Other Topics Concern  . Not on file   Social History Narrative   Works out [employer].     Family History  Problem Relation Age of Onset  . Hypertension Sister   . Hypertension Brother   . Hypertension Sister   . Heart failure Mother 4    Outpatient Encounter Prescriptions as of 05/19/2013  Medication Sig Dispense Refill  . aspirin 81 MG tablet Take 81 mg by mouth daily.      . fish oil-omega-3 fatty acids 1000 MG capsule Take 1 g by mouth daily.      . metFORMIN (GLUCOPHAGE) 500 MG tablet Take 1 tablet (500 mg total) by mouth daily with breakfast.  90 tablet  1  . metoprolol tartrate (LOPRESSOR) 25 MG tablet Take 25 mg by mouth 2 (two) times daily.       . nitroGLYCERIN (NITROSTAT) 0.4 MG SL tablet Place 1 tablet (0.4 mg total) under the tongue every 5 (five) minutes as needed for chest pain.  100 tablet  6  . NON FORMULARY Glucometer, strips, and lancets to check dailly       . Olmesartan-Amlodipine-HCTZ (TRIBENZOR) 40-10-25 MG TABS Take 1 tablet by mouth daily.  90 tablet  1  . Pitavastatin Calcium (LIVALO) 4 MG TABS Take 1 tablet (4 mg total) by mouth at bedtime.  90 tablet  1  . prasugrel (EFFIENT) 5 MG TABS Take 5 mg by mouth daily.      . tadalafil (CIALIS) 20 MG tablet Take 20 mg by mouth daily as needed for erectile dysfunction.        No facility-administered encounter medications on file as of 05/19/2013.       Objective:   Physical Exam  Constitutional: He is oriented to person, place, and time. He appears well-developed and well-nourished.  HENT:  Head: Normocephalic and atraumatic.  Cardiovascular: Normal rate, regular rhythm and normal heart sounds.  Pulmonary/Chest: Effort normal and breath sounds normal.  Musculoskeletal: He exhibits no edema.  Neurological: He is alert and oriented to person, place, and time.  Skin: Skin is warm and dry.  Psychiatric: He has a normal mood and affect. His behavior is normal.          Assessment & Plan:  DM- Well controlled.  ON ASA, statin, fish oil, ARB. F/U in 3-4 month.  Lab Results  Component Value Date   HGBA1C 6.1 05/19/2013     HTN- well controlled.    Hyperlipidemia - doing well overall.   Lab Results  Component Value Date   CHOL 183 09/30/2012   HDL 27* 09/30/2012   LDLCALC 88 09/30/2012   TRIG 177* 11/04/2012   CHOLHDL 6.8 09/30/2012     CAD- on ASA, statin, betablocker. F/U with cards on Thursday.

## 2013-06-05 ENCOUNTER — Ambulatory Visit: Payer: BC Managed Care – PPO | Admitting: Cardiology

## 2013-06-10 ENCOUNTER — Other Ambulatory Visit: Payer: Self-pay | Admitting: *Deleted

## 2013-06-10 MED ORDER — METOPROLOL TARTRATE 25 MG PO TABS: 25.0000 mg | ORAL_TABLET | Freq: Two times a day (BID) | ORAL | Status: DC

## 2013-06-10 MED ORDER — PRASUGREL HCL 5 MG PO TABS: 10.0000 mg | ORAL_TABLET | Freq: Every day | ORAL | Status: DC

## 2013-06-23 ENCOUNTER — Encounter: Payer: Self-pay | Admitting: Cardiology

## 2013-08-24 ENCOUNTER — Emergency Department
Admission: EM | Admit: 2013-08-24 | Discharge: 2013-08-24 | Disposition: A | Discharge status: Home / Self Care | Payer: BC Managed Care – PPO | Source: Home / Self Care | Attending: Emergency Medicine | Admitting: Emergency Medicine

## 2013-08-24 ENCOUNTER — Encounter: Payer: Self-pay | Admitting: Emergency Medicine

## 2013-08-24 DIAGNOSIS — M109 Gout, unspecified: Secondary | ICD-10-CM

## 2013-08-24 DIAGNOSIS — M79609 Pain in unspecified limb: Secondary | ICD-10-CM

## 2013-08-24 DIAGNOSIS — M79672 Pain in left foot: Secondary | ICD-10-CM

## 2013-08-24 HISTORY — DX: Type 2 diabetes mellitus without complications: E11.9

## 2013-08-24 MED ORDER — INDOMETHACIN 25 MG PO CAPS: 25.0000 mg | ORAL_CAPSULE | Freq: Three times a day (TID) | ORAL | Status: DC

## 2013-08-24 MED ORDER — PREDNISONE 20 MG PO TABS: 20.0000 mg | ORAL_TABLET | Freq: Two times a day (BID) | ORAL | Status: DC

## 2013-08-24 NOTE — ED Notes (Signed)
States he was driving on Tuesday and noted a sharp pain to left foot, painful to touch and ambulation

## 2013-08-24 NOTE — ED Provider Notes (Signed)
CSN: [id number]     Arrival date & time 08/24/13  1532 History   First MD Initiated Contact with Patient 08/24/13 1534     Chief Complaint  Patient presents with  . Foot Pain   (Consider location/radiation/quality/duration/timing/severity/associated sxs/prior Treatment) HPI Left foot pain for the last 2 days.  No known injury or trauma.  No family history of gout, but does have a history of hypertension and diabetes.  However he does take hydrochlorothiazide and aspirin but no niacin.  And drinks beer on weekends but was not drinking before this flare.  Located in left MTP joint and is constant and severe and causes pain with ambulation.  Better with resting and soaking.  Also does report a history of left knee pain a few months ago that only lasted for a few days and improved with BenGay.   Past Medical History  Diagnosis Date  . Hyperlipidemia   . Hypertension   . Hx of cardiovascular stress test     a. ETT-Myoview 3/14:  EF 56%, + inferior ischemia  . Diabetes mellitus without complication    Past Surgical History  Procedure Laterality Date  . Coronary angioplasty with stent placement  04/2013    Dr. Chales Abrahams at [facility] History  Problem Relation Age of Onset  . Hypertension Sister   . Hypertension Brother   . Hypertension Sister   . Heart failure Mother 25   History  Substance Use Topics  . Smoking status: Never Smoker   . Smokeless tobacco: Never Used  . Alcohol Use: Yes     Comment: 12 per month    Review of Systems  All other systems reviewed and are negative.    Allergies  Review of patient's allergies indicates no known allergies.  Home Medications   Current Outpatient Rx  Name  Route  Sig  Dispense  Refill  . aspirin 81 MG tablet   Oral   Take 81 mg by mouth daily.         . fish oil-omega-3 fatty acids 1000 MG capsule   Oral   Take 1 g by mouth daily.         . indomethacin (INDOCIN) 25 MG capsule   Oral   Take 1 capsule (25 mg total)  by mouth 3 (three) times daily with meals.   30 capsule   0   . metFORMIN (GLUCOPHAGE) 500 MG tablet   Oral   Take 1 tablet (500 mg total) by mouth daily with breakfast.   90 tablet   1   . metoprolol tartrate (LOPRESSOR) 25 MG tablet   Oral   Take 1 tablet (25 mg total) by mouth 2 (two) times daily.   180 tablet   4   . nitroGLYCERIN (NITROSTAT) 0.4 MG SL tablet   Sublingual   Place 1 tablet (0.4 mg total) under the tongue every 5 (five) minutes as needed for chest pain.   100 tablet   6     4 bottles of 25 tablets in each bottle   . NON FORMULARY      Glucometer, strips, and lancets to check dailly          . Olmesartan-Amlodipine-HCTZ (TRIBENZOR) 40-10-25 MG TABS   Oral   Take 1 tablet by mouth daily.   90 tablet   1   . Pitavastatin Calcium (LIVALO) 4 MG TABS   Oral   Take 1 tablet (4 mg total) by mouth at bedtime.   90 tablet  1   . prasugrel (EFFIENT) 5 MG TABS tablet   Oral   Take 2 tablets (10 mg total) by mouth daily.   180 tablet   4   . predniSONE (DELTASONE) 20 MG tablet   Oral   Take 1 tablet (20 mg total) by mouth 2 (two) times daily.   6 tablet   0   . tadalafil (CIALIS) 20 MG tablet   Oral   Take 20 mg by mouth daily as needed for erectile dysfunction.           BP 103/67  Pulse 93  Temp(Src) 98 F (36.7 C) (Oral)  Ht 5\' 9"  (1.753 m)  Wt 205 lb (92.987 kg)  BMI 30.26 kg/m2  SpO2 97% Physical Exam  Nursing note and vitals reviewed. Constitutional: He is oriented to person, place, and time. He appears well-developed and well-nourished.  HENT:  Head: Normocephalic and atraumatic.  Eyes: No scleral icterus.  Neck: Neck supple.  Cardiovascular: Regular rhythm and normal heart sounds.   Pulmonary/Chest: Effort normal and breath sounds normal. No respiratory distress.  Musculoskeletal:  L ankle/foot: FROM, +TTP MTP joint with mild warmth.   No TTP medial/lateral malleolus, navicular, base of 5th, calcaneus, Achilles, proximal  fibula.  No swelling.  No ecchymoses.  Distal neurovascular status is intact.  Antalgic gait.    Neurological: He is alert and oriented to person, place, and time.  Skin: Skin is warm and dry.  Psychiatric: He has a normal mood and affect. His speech is normal.    ED Course  Procedures (including critical care time) Labs Review Labs Reviewed - No data to display Imaging Review No results found.  EKG Interpretation     Ventricular Rate:    PR Interval:    QRS Duration:   QT Interval:    QTC Calculation:   R Axis:     Text Interpretation:              MDM   1. Left foot pain   2. Gout attack    This patient has gout (podagra) on the left foot. Encourage rest, ice, and elevation of injured body part.  The role of anti-inflammatories is discussed with the patient.  I placed him on prednisone for a few days as well as indomethacin.  I advised him to that he is to inform  his PCP that  the next time they  draw blood to check a uric acid level and renal function.  If not improving may need to go have a intra-articular injection and aspiration to rule out gout versus CPPD since he did state that he has some knee pain a few months ago.  Advised patient that in 60% of patients, he will have another flare within the next year. Also discussed a gout diet with him although he states his yard he does not eat red meat.      Marlaine Hind, MD 08/24/13 [phone]

## 2013-09-01 ENCOUNTER — Encounter: Payer: Self-pay | Admitting: Physician Assistant

## 2013-09-01 ENCOUNTER — Ambulatory Visit (INDEPENDENT_AMBULATORY_CARE_PROVIDER_SITE_OTHER): Payer: BC Managed Care – PPO | Admitting: Physician Assistant

## 2013-09-01 ENCOUNTER — Ambulatory Visit (INDEPENDENT_AMBULATORY_CARE_PROVIDER_SITE_OTHER): Payer: BC Managed Care – PPO | Admitting: Sports Medicine

## 2013-09-01 ENCOUNTER — Ambulatory Visit: Payer: BC Managed Care – PPO | Admitting: Family Medicine

## 2013-09-01 VITALS — BP 99/66 | HR 81 | Wt 208.0 lb

## 2013-09-01 DIAGNOSIS — M109 Gout, unspecified: Secondary | ICD-10-CM

## 2013-09-01 DIAGNOSIS — B351 Tinea unguium: Secondary | ICD-10-CM

## 2013-09-01 MED ORDER — TERBINAFINE HCL 250 MG PO TABS: 250.0000 mg | ORAL_TABLET | Freq: Every day | ORAL | Status: DC

## 2013-09-01 MED ORDER — HYDROCODONE-ACETAMINOPHEN 5-325 MG PO TABS: 1.0000 | ORAL_TABLET | Freq: Three times a day (TID) | ORAL | Status: DC | PRN

## 2013-09-01 NOTE — Patient Instructions (Signed)
Gout  Gout is an inflammatory condition (arthritis) caused by a buildup of uric acid crystals in the joints. Uric acid is a chemical that is normally present in the blood. Under some circumstances, uric acid can form into crystals in your joints. This causes joint redness, soreness, and swelling (inflammation). Repeat attacks are common. Over time, uric acid crystals can form into masses (tophi) near a joint, causing disfigurement. Gout is treatable and often preventable.  CAUSES   The disease begins with elevated levels of uric acid in the blood. Uric acid is produced by your body when it breaks down a naturally found substance called purines. This also happens when you eat certain foods such as meats and fish. Causes of an elevated uric acid level include:   Being passed down from parent to child (heredity).   Diseases that cause increased uric acid production (obesity, psoriasis, some cancers).   Excessive alcohol use.   Diet, especially diets rich in meat and seafood.   Medicines, including certain cancer-fighting drugs (chemotherapy), diuretics, and aspirin.   Chronic kidney disease. The kidneys are no longer able to remove uric acid well.   Problems with metabolism.  Conditions strongly associated with gout include:   Obesity.   High blood pressure.   High cholesterol.   Diabetes.  Not everyone with elevated uric acid levels gets gout. It is not understood why some people get gout and others do not. Surgery, joint injury, and eating too much of certain foods are some of the factors that can lead to gout.  SYMPTOMS    An attack of gout comes on quickly. It causes intense pain with redness, swelling, and warmth in a joint.   Fever can occur.   Often, only one joint is involved. Certain joints are more commonly involved:   Base of the big toe.   Knee.   Ankle.   Wrist.   Finger.  Without treatment, an attack usually goes away in a few days to weeks. Between attacks, you usually will not have  symptoms, which is different from many other forms of arthritis.  DIAGNOSIS   Your caregiver will suspect gout based on your symptoms and exam. Removal of fluid from the joint (arthrocentesis) is done to check for uric acid crystals. Your caregiver will give you a medicine that numbs the area (local anesthetic) and use a needle to remove joint fluid for exam. Gout is confirmed when uric acid crystals are seen in joint fluid, using a special microscope. Sometimes, blood, urine, and X-ray tests are also used.  TREATMENT   There are 2 phases to gout treatment: treating the sudden onset (acute) attack and preventing attacks (prophylaxis).  Treatment of an Acute Attack   Medicines are used. These include anti-inflammatory medicines or steroid medicines.   An injection of steroid medicine into the affected joint is sometimes necessary.   The painful joint is rested. Movement can worsen the arthritis.   You may use warm or cold treatments on painful joints, depending which works best for you.   Discuss the use of coffee, vitamin C, or cherries with your caregiver. These may be helpful treatment options.  Treatment to Prevent Attacks  After the acute attack subsides, your caregiver may advise prophylactic medicine. These medicines either help your kidneys eliminate uric acid from your body or decrease your uric acid production. You may need to stay on these medicines for a very long time.  The early phase of treatment with prophylactic medicine can be associated   with an increase in acute gout attacks. For this reason, during the first few months of treatment, your caregiver may also advise you to take medicines usually used for acute gout treatment. Be sure you understand your caregiver's directions.  You should also discuss dietary treatment with your caregiver. Certain foods such as meats and fish can increase uric acid levels. Other foods such as dairy can decrease levels. Your caregiver can give you a list of foods  to avoid.  HOME CARE INSTRUCTIONS    Do not take aspirin to relieve pain. This raises uric acid levels.   Only take over-the-counter or prescription medicines for pain, discomfort, or fever as directed by your caregiver.   Rest the joint as much as possible. When in bed, keep sheets and blankets off painful areas.   Keep the affected joint raised (elevated).   Use crutches if the painful joint is in your leg.   Drink enough water and fluids to keep your urine clear or pale yellow. This helps your body get rid of uric acid. Do not drink alcoholic beverages. They slow the passage of uric acid.   Follow your caregiver's dietary instructions. Pay careful attention to the amount of protein you eat. Your daily diet should emphasize fruits, vegetables, whole grains, and fat-free or low-fat milk products.   Maintain a healthy body weight.  SEEK MEDICAL CARE IF:    You have an oral temperature above 102 F (38.9 C).   You develop diarrhea, vomiting, or any side effects from medicines.   You do not feel better in 24 hours, or you are getting worse.  SEEK IMMEDIATE MEDICAL CARE IF:    Your joint becomes suddenly more tender and you have:   Chills.   An oral temperature above 102 F (38.9 C), not controlled by medicine.  MAKE SURE YOU:    Understand these instructions.   Will watch your condition.   Will get help right away if you are not doing well or get worse.  Document Released: 10/20/2000 Document Revised: 01/15/2012 Document Reviewed: 01/31/2010  [employer] Patient Information 2014 [employer].

## 2013-09-01 NOTE — Assessment & Plan Note (Signed)
6 months of Lamisil given.

## 2013-09-01 NOTE — Progress Notes (Signed)
  Subjective:    Patient ID: Steven Estrada, male    DOB: Mar 08, 1956, 57 y.o.   MRN: [id number]  HPI Patient presents to the clinic with left great toe pain and suspected gout. Seen in UC on 10/19 and given prednisone and indomethicin with no relief. Denies any red meat recently or alcohol excessive  Consumption.   Review of Systems     Objective:   Physical Exam  Constitutional: He is oriented to person, place, and time. He appears well-developed and well-nourished.  HENT:  Head: Normocephalic and atraumatic.  Cardiovascular: Normal rate, regular rhythm and normal heart sounds.   Pulmonary/Chest: Effort normal and breath sounds normal.  Musculoskeletal:  Left great toe warm to the touch around the joint. Slightly swollen around MTP. Extreme pain with any palpation around MTP.  Neurological: He is alert and oriented to person, place, and time.          Assessment & Plan:  Gout- Will get uric acid and check renal function. Reiterated gout diet. Followup with PCP to manage ongoing symptoms.  Dr. Bonnita Levan was consulted and gave injection today into Left MTP. He will also follow up xrays. He was also given small dose of Vicodin for pain relief.

## 2013-09-01 NOTE — Assessment & Plan Note (Addendum)
Currently having acute flare of podagra. Attempted aspiration with successful injection as above. Tandy Gaw, PA-C will be obtaining uric acid levels in the near future. He can followup medical treatment with his PCP. I'm happy to perform injections for future flares of monoarticular gout. Hydrocodone as needed for pain.

## 2013-09-01 NOTE — Progress Notes (Addendum)
   Subjective:    I'm seeing this patient as a consultation for:  Tandy Gaw, PA-C  CC: Toe pain  HPI: Nail fungus: Has never been treated, symptoms are mild, persistent, mild itching.  Toe pain: Swelling occurred fairly rapidly over several hours, now is exquisite, at the first metatarsophalangeal joint, localized, severe, no radiation. He was seen by Tandy Gaw, PA-C, I was called for consideration of interventional treatment.  Past medical history, Surgical history, Family history not pertinant except as noted below, Social history, Allergies, and medications have been entered into the medical record, reviewed, and no changes needed.   Review of Systems: No headache, visual changes, nausea, vomiting, diarrhea, constipation, dizziness, abdominal pain, skin rash, fevers, chills, night sweats, weight loss, swollen lymph nodes, body aches, joint swelling, muscle aches, chest pain, shortness of breath, mood changes, visual or auditory hallucinations.   Objective:   General: Well Developed, well nourished, and in no acute distress.  Neuro/Psych: Alert and oriented x3, extra-ocular muscles intact, able to move all 4 extremities, sensation grossly intact. Skin: Warm and dry, no rashes noted. Visible onychomycosis. Respiratory: Not using accessory muscles, speaking in full sentences, trachea midline.  Cardiovascular: Pulses palpable, no extremity edema. Abdomen: Does not appear distended.  Procedure: Real-time Ultrasound Guided aspiration/Injection of left great toe Device: GE Logiq E  Verbal informed consent obtained.  Time-out conducted.  Noted no overlying erythema, induration, or other signs of local infection.  Skin prepped in a sterile fashion.  Local anesthesia: Topical Ethyl chloride.  With sterile technique and under real time ultrasound guidance:  25-gauge needle advanced into the metatarsophalangeal joint, first, on the left foot, I was unable to aspirate any fluid, syringe  and switched and 0.5 cc Kenalog 40, 0.5 cc lidocaine injected easily into the joint. Noted extremely marked onychomycosis. Completed without difficulty  Pain immediately resolved suggesting accurate placement of the medication.  Advised to call if fevers/chills, erythema, induration, drainage, or persistent bleeding.  Images permanently stored and available for review in the ultrasound unit.  Impression: Technically successful ultrasound guided injection.  Impression and Recommendations:    This case required medical decision making of moderate complexity.

## 2013-09-02 LAB — COMPLETE METABOLIC PANEL WITH GFR
AST: 13 U/L (ref 0–37)
Albumin: 4.6 g/dL (ref 3.5–5.2)
BUN: 23 mg/dL (ref 6–23)
Calcium: 9.8 mg/dL (ref 8.4–10.5)
Chloride: 101 mEq/L (ref 96–112)
Potassium: 4.4 mEq/L (ref 3.5–5.3)

## 2013-09-02 LAB — URIC ACID: Uric Acid, Serum: 8 mg/dL — ABNORMAL HIGH (ref 4.0–7.8)

## 2013-09-10 NOTE — Addendum Note (Signed)
Addended by: Monica Becton on: 09/10/2013 12:52 PM   Modules accepted: Level of Service

## 2013-09-11 ENCOUNTER — Other Ambulatory Visit: Payer: Self-pay | Admitting: Family Medicine

## 2013-09-22 ENCOUNTER — Other Ambulatory Visit: Payer: Self-pay | Admitting: Family Medicine

## 2013-09-22 ENCOUNTER — Ambulatory Visit: Payer: BC Managed Care – PPO | Admitting: Family Medicine

## 2013-09-29 ENCOUNTER — Ambulatory Visit: Payer: BC Managed Care – PPO | Admitting: Sports Medicine

## 2013-09-29 DIAGNOSIS — Z0289 Encounter for other administrative examinations: Secondary | ICD-10-CM

## 2013-10-06 ENCOUNTER — Encounter: Payer: BC Managed Care – PPO | Admitting: Family Medicine

## 2013-10-06 DIAGNOSIS — Z0289 Encounter for other administrative examinations: Secondary | ICD-10-CM

## 2013-10-07 NOTE — Progress Notes (Signed)
   Subjective:    Patient ID: Steven Estrada, male    DOB: [birth year], 57 y.o.   MRN: [id number]  HPI    Review of Systems     Objective:   Physical Exam        Assessment & Plan:  Patient did not show for office visit.

## 2014-01-17 ENCOUNTER — Other Ambulatory Visit: Payer: Self-pay | Admitting: Family Medicine

## 2014-01-28 ENCOUNTER — Other Ambulatory Visit: Payer: Self-pay | Admitting: Family Medicine

## 2014-05-13 ENCOUNTER — Telehealth: Payer: Self-pay

## 2014-05-13 NOTE — Telephone Encounter (Signed)
Mr. Steven Estrada called and left a message that he has an appointment with Dr. Linford ArnoldMetheney on the 17th and he doesn't have enough medicine to get him to then. I returned his call but had to  leave a message for him to return my call to let me know which meds he needs to refill and we can refill them but he has to come to his appointment to get more refills./[name],CMA

## 2014-05-15 ENCOUNTER — Other Ambulatory Visit: Payer: Self-pay | Admitting: Family Medicine

## 2014-05-22 ENCOUNTER — Ambulatory Visit: Payer: BC Managed Care – PPO | Admitting: Family Medicine

## 2014-05-26 NOTE — Telephone Encounter (Signed)
error 

## 2014-06-22 ENCOUNTER — Ambulatory Visit (INDEPENDENT_AMBULATORY_CARE_PROVIDER_SITE_OTHER): Payer: BC Managed Care – PPO | Admitting: Family Medicine

## 2014-06-22 ENCOUNTER — Encounter: Payer: Self-pay | Admitting: Family Medicine

## 2014-06-22 VITALS — BP 147/96 | HR 117 | Ht 68.6 in | Wt 216.0 lb

## 2014-06-22 DIAGNOSIS — M1A09X0 Idiopathic chronic gout, multiple sites, without tophus (tophi): Secondary | ICD-10-CM

## 2014-06-22 DIAGNOSIS — E785 Hyperlipidemia, unspecified: Secondary | ICD-10-CM

## 2014-06-22 DIAGNOSIS — M1A00X0 Idiopathic chronic gout, unspecified site, without tophus (tophi): Secondary | ICD-10-CM

## 2014-06-22 DIAGNOSIS — E119 Type 2 diabetes mellitus without complications: Secondary | ICD-10-CM

## 2014-06-22 DIAGNOSIS — Z23 Encounter for immunization: Secondary | ICD-10-CM

## 2014-06-22 DIAGNOSIS — I10 Essential (primary) hypertension: Secondary | ICD-10-CM

## 2014-06-22 LAB — LIPID PANEL
CHOL/HDL RATIO: 5.8 ratio
CHOLESTEROL: 181 mg/dL (ref 0–200)
HDL: 31 mg/dL — AB (ref >39)
LDL Cholesterol: 102 mg/dL — ABNORMAL HIGH (ref 0–99)
Triglycerides: 242 mg/dL — ABNORMAL HIGH (ref <150)
VLDL: 48 mg/dL — ABNORMAL HIGH (ref 0–40)

## 2014-06-22 LAB — COMPLETE METABOLIC PANEL WITH GFR
ALT: 21 U/L (ref 0–53)
AST: 18 U/L (ref 0–37)
Albumin: 4.6 g/dL (ref 3.5–5.2)
Alkaline Phosphatase: 51 U/L (ref 39–117)
BILIRUBIN TOTAL: 0.4 mg/dL (ref 0.2–1.2)
BUN: 18 mg/dL (ref 6–23)
CALCIUM: 9.8 mg/dL (ref 8.4–10.5)
CO2: 29 mEq/L (ref 19–32)
Chloride: 100 mEq/L (ref 96–112)
Creat: 1.04 mg/dL (ref 0.50–1.35)
GFR, Est African American: >89 mL/min
GFR, Est Non African American: 80 mL/min
Glucose, Bld: 148 mg/dL — ABNORMAL HIGH (ref 70–99)
Potassium: 3.9 mEq/L (ref 3.5–5.3)
Sodium: 139 mEq/L (ref 135–145)
Total Protein: 7.2 g/dL (ref 6.0–8.3)

## 2014-06-22 LAB — POCT UA - MICROALBUMIN
Creatinine, POC: 300 mg/dL
MICROALBUMIN (UR) POC: 80 mg/L

## 2014-06-22 LAB — POCT GLYCOSYLATED HEMOGLOBIN (HGB A1C): HEMOGLOBIN A1C: 6.9

## 2014-06-22 LAB — URIC ACID: URIC ACID, SERUM: 8.4 mg/dL — AB (ref 4.0–7.8)

## 2014-06-22 LAB — LDL CHOLESTEROL, DIRECT: Direct LDL: 125 mg/dL — ABNORMAL HIGH

## 2014-06-22 MED ORDER — COLCHICINE 0.6 MG PO CAPS: 1.0000 | ORAL_CAPSULE | Freq: Every day | ORAL | Status: DC

## 2014-06-22 MED ORDER — PITAVASTATIN CALCIUM 4 MG PO TABS: ORAL_TABLET | ORAL | Status: DC

## 2014-06-22 MED ORDER — OLMESARTAN-AMLODIPINE-HCTZ 40-10-25 MG PO TABS: ORAL_TABLET | ORAL | Status: DC

## 2014-06-22 MED ORDER — ALLOPURINOL 100 MG PO TABS: 100.0000 mg | ORAL_TABLET | Freq: Every day | ORAL | Status: DC

## 2014-06-22 MED ORDER — METFORMIN HCL 500 MG PO TABS: ORAL_TABLET | ORAL | Status: DC

## 2014-06-22 NOTE — Assessment & Plan Note (Signed)
Overall well-controlled. He's had 2 flares in the last year and there were none month apart. We discussed the importance of putting him on prophylaxis. We'll start allopurinol 100 mg daily. Will check a baseline uric acid level today. We'll also place him on colchicine with the albuterol for 6 months and then he'll be able to discontinue the colchicine at that time. Coupon card provided.

## 2014-06-22 NOTE — Assessment & Plan Note (Signed)
Continue Livalo. New prescriptions sent. Due to recheck lipids and liver enzymes.

## 2014-06-22 NOTE — Assessment & Plan Note (Signed)
Well-controlled on current regimen. ?

## 2014-06-22 NOTE — Assessment & Plan Note (Signed)
Well-controlled today with a hemoglobin A1c of 6.9. This is up a little bit for him. Foot exam performed today. Urine micron been performed today. He actually has A. exam scheduled for later today. Flu vaccine updated. He's taking his statin and ARB and aspirin. Encouraged him to get back into regular exercise now that he is one year status post stent placement.

## 2014-06-22 NOTE — Progress Notes (Signed)
   Subjective:    Patient ID: Taivon C. Dan HumphreysWalker, male    DOB: [birth year], 58 y.o.   MRN: [id number]  HPI Diabetes - no hypoglycemic events. No wounds or sores that are not healing well. No increased thirst or urination. Checking glucose at home. Taking medications as prescribed without any side effects. Eye check today.    Hypertension- Pt denies chest pain, SOB, dizziness, or heart palpitations.  Taking meds as directed w/o problems.  Denies medication side effects.    Hyperlipidemia - On livalo and tolerating well. No S.E.   Gout- he had a recent flare that lasts about 2 weeks. The one prior was about 9 months ago. The side effect of his left ankle. Review of Systems     Objective:   Physical Exam  Constitutional: He is oriented to person, place, and time. He appears well-developed and well-nourished.  HENT:  Head: Normocephalic and atraumatic.  Cardiovascular: Normal rate, regular rhythm and normal heart sounds.   Pulmonary/Chest: Effort normal and breath sounds normal.  Neurological: He is alert and oriented to person, place, and time.  Skin: Skin is warm and dry.  Psychiatric: He has a normal mood and affect. His behavior is normal.          Assessment & Plan:   Given flu vaccine

## 2014-06-23 ENCOUNTER — Other Ambulatory Visit: Payer: Self-pay | Admitting: Family Medicine

## 2014-06-23 LAB — PSA: PSA: 0.5 ng/mL (ref <=4.00)

## 2014-06-23 MED ORDER — ALLOPURINOL 300 MG PO TABS: 300.0000 mg | ORAL_TABLET | Freq: Every day | ORAL | Status: DC

## 2014-06-30 ENCOUNTER — Other Ambulatory Visit: Payer: Self-pay | Admitting: *Deleted

## 2014-06-30 MED ORDER — ATORVASTATIN CALCIUM 40 MG PO TABS: 40.0000 mg | ORAL_TABLET | Freq: Every day | ORAL | Status: DC

## 2014-07-04 ENCOUNTER — Other Ambulatory Visit: Payer: Self-pay | Admitting: Family Medicine

## 2014-07-20 LAB — HM DIABETES EYE EXAM

## 2014-07-28 ENCOUNTER — Encounter: Payer: Self-pay | Admitting: Family Medicine

## 2014-08-31 ENCOUNTER — Ambulatory Visit: Payer: BC Managed Care – PPO

## 2014-09-21 ENCOUNTER — Ambulatory Visit: Payer: BC Managed Care – PPO | Admitting: Family Medicine

## 2014-10-06 IMAGING — CR DG CHEST 2V
2 series · 2 of 2 positions shown · non-contrast
Comparison: None.

CLINICAL DATA: Pre cath.  No chest complaints.

CHEST - 2 VIEW

[view not recorded (1 of 2)]
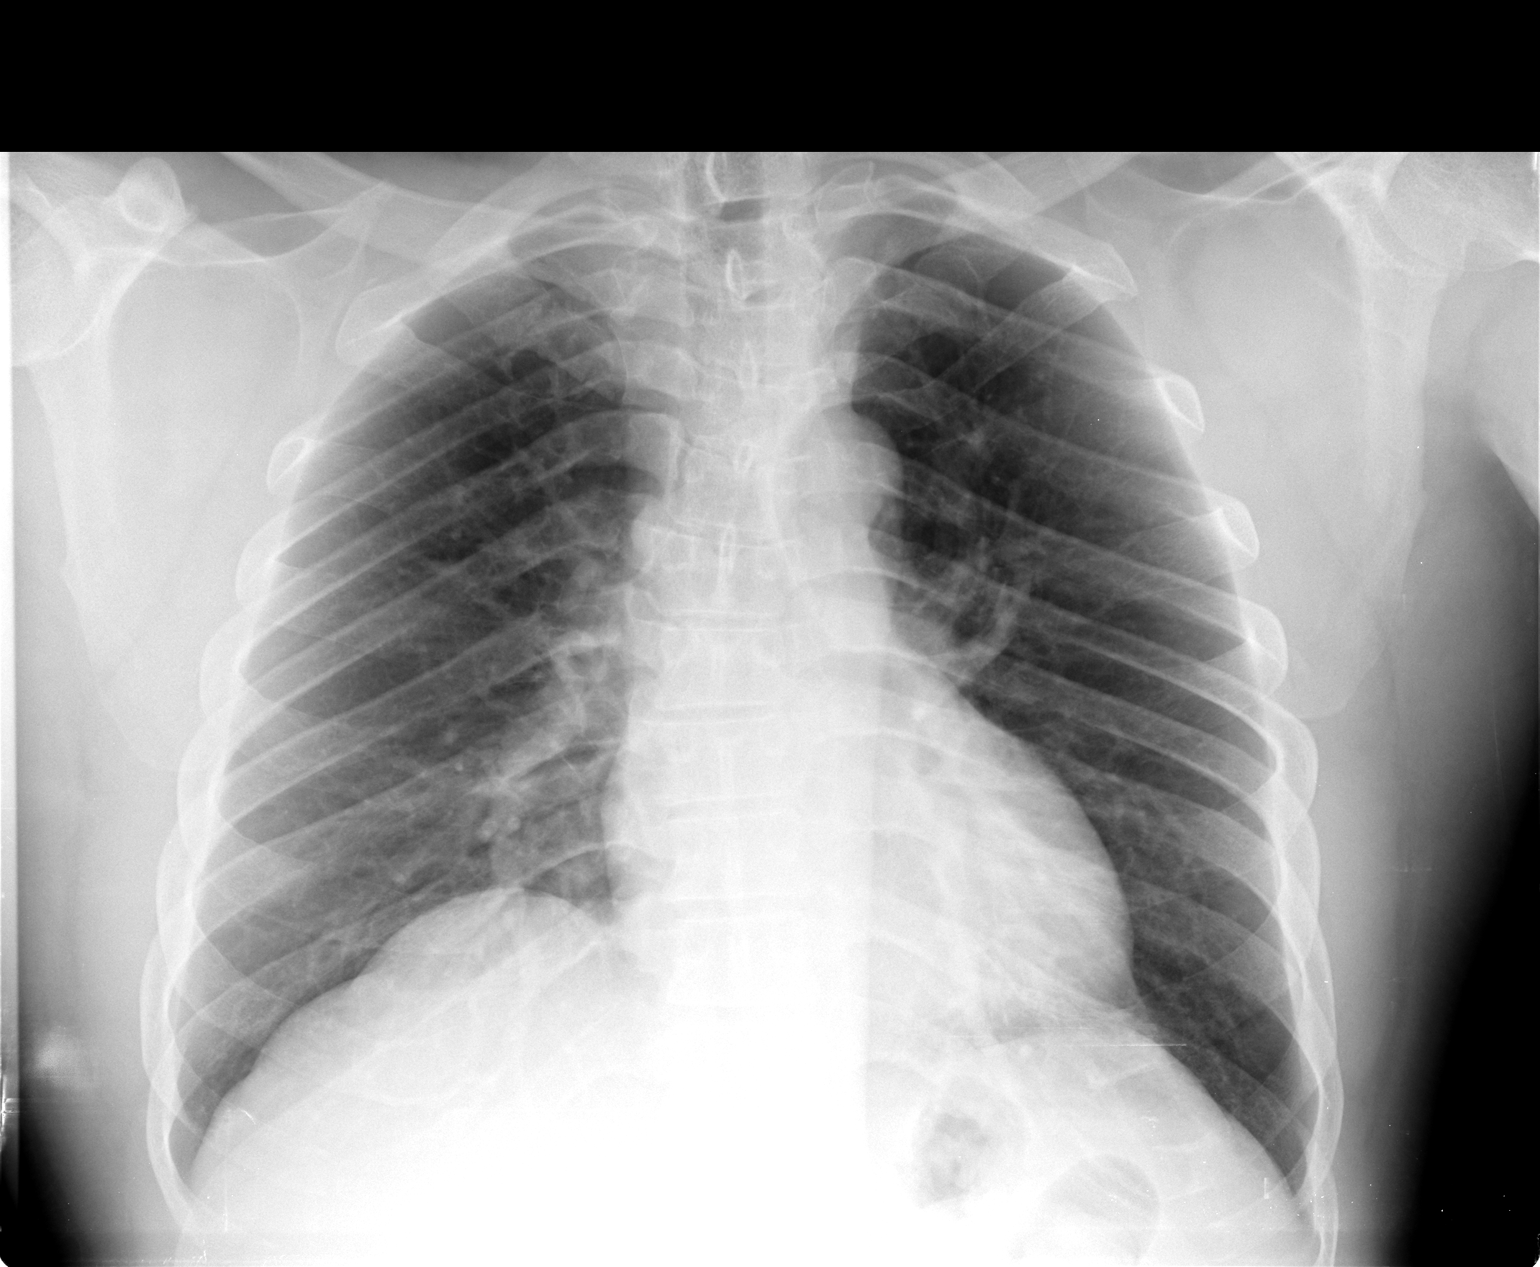

[view not recorded (2 of 2)]
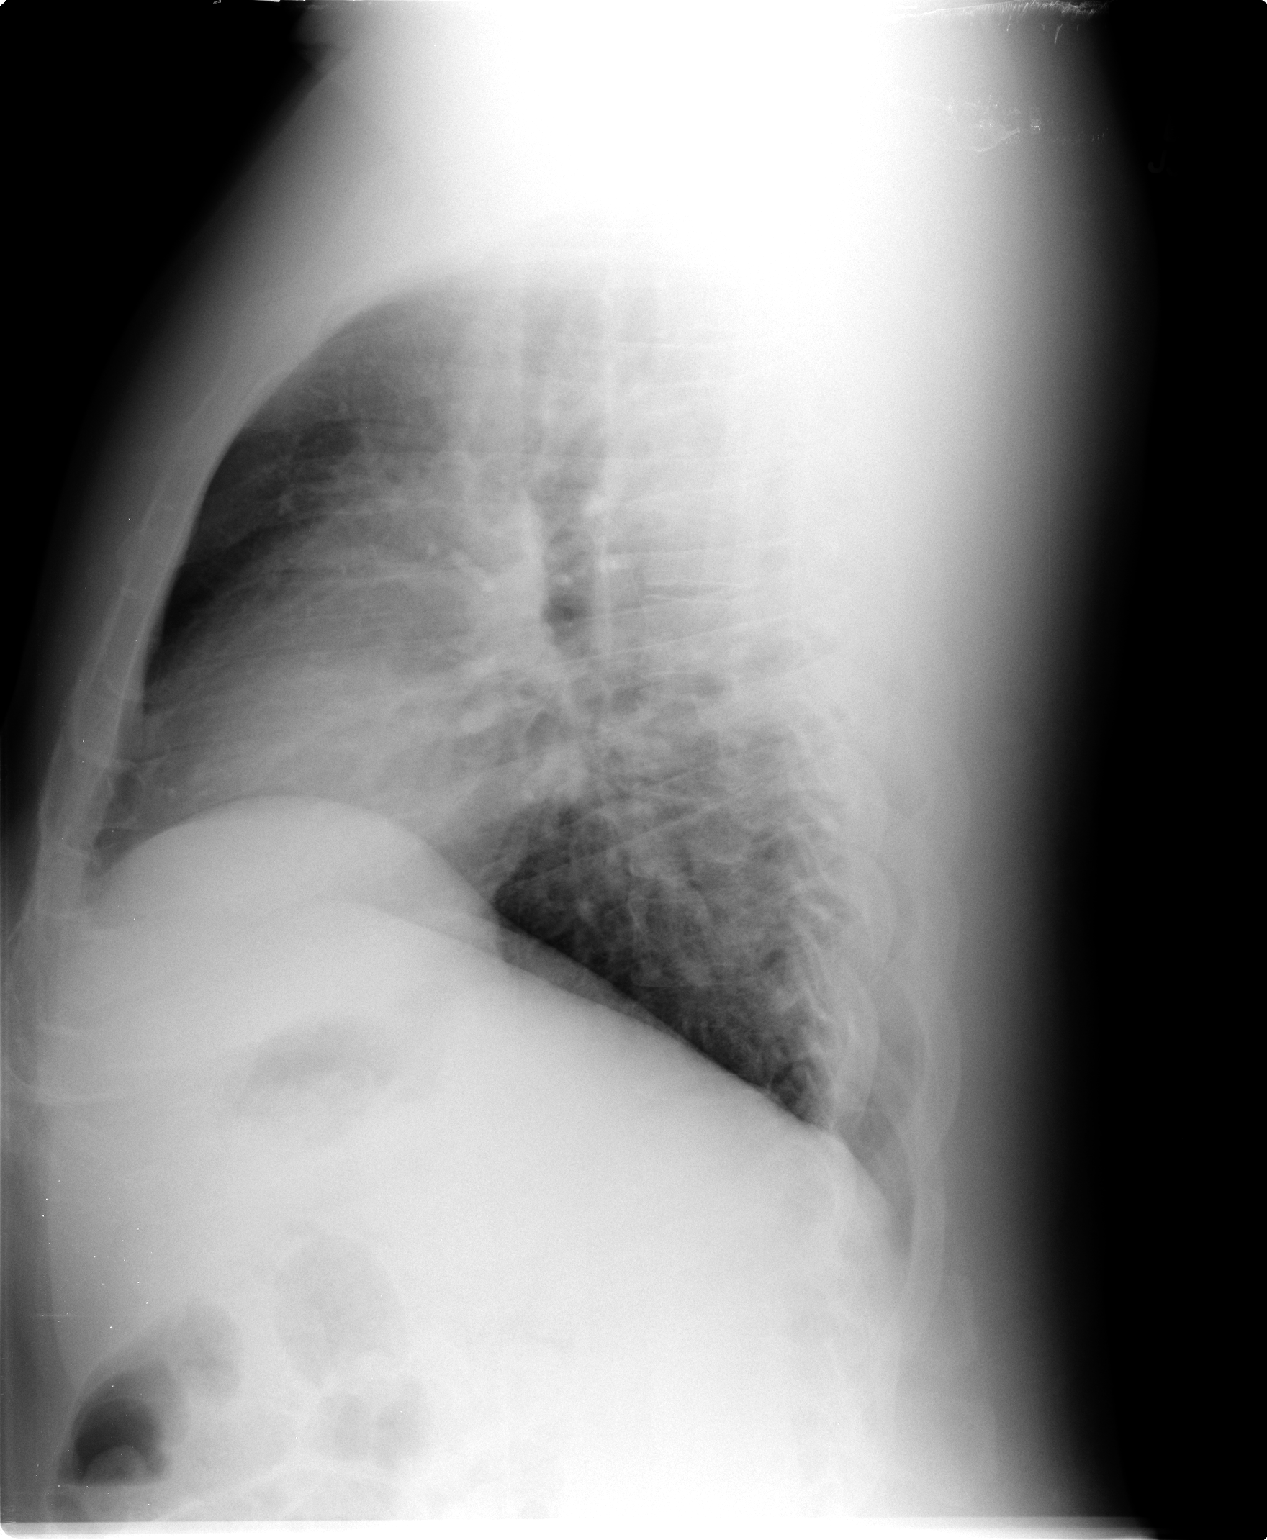

[2 of 2 positions shown; findings below may reference images not displayed]

FINDINGS: The heart, mediastinum and hilar contours are normal.
The lungs are clear.  There are no effusions or pneumothoraces.
The bony thorax is intact.
IMPRESSION: No active disease.

## 2014-10-15 ENCOUNTER — Encounter (HOSPITAL_COMMUNITY): Payer: Self-pay | Admitting: Cardiology

## 2014-11-01 ENCOUNTER — Other Ambulatory Visit: Payer: Self-pay | Admitting: Family Medicine

## 2014-11-23 ENCOUNTER — Ambulatory Visit: Payer: BC Managed Care – PPO | Admitting: Family Medicine

## 2014-12-13 ENCOUNTER — Other Ambulatory Visit: Payer: Self-pay | Admitting: Family Medicine

## 2015-02-05 ENCOUNTER — Ambulatory Visit: Payer: Self-pay | Admitting: Family Medicine

## 2015-02-13 ENCOUNTER — Other Ambulatory Visit: Payer: Self-pay | Admitting: Family Medicine

## 2015-04-23 ENCOUNTER — Other Ambulatory Visit: Payer: Self-pay | Admitting: Family Medicine

## 2015-05-11 ENCOUNTER — Other Ambulatory Visit: Payer: Self-pay

## 2015-05-11 MED ORDER — OLMESARTAN-AMLODIPINE-HCTZ 40-10-25 MG PO TABS: 1.0000 | ORAL_TABLET | Freq: Every day | ORAL | Status: DC

## 2015-05-11 MED ORDER — METFORMIN HCL 500 MG PO TABS: 500.0000 mg | ORAL_TABLET | Freq: Two times a day (BID) | ORAL | Status: DC

## 2015-05-26 DIAGNOSIS — R9439 Abnormal result of other cardiovascular function study: Secondary | ICD-10-CM | POA: Insufficient documentation

## 2015-05-31 ENCOUNTER — Ambulatory Visit (INDEPENDENT_AMBULATORY_CARE_PROVIDER_SITE_OTHER): Payer: BLUE CROSS/BLUE SHIELD | Admitting: Family Medicine

## 2015-05-31 ENCOUNTER — Encounter: Payer: Self-pay | Admitting: Family Medicine

## 2015-05-31 VITALS — BP 122/88 | HR 109 | Ht 69.0 in | Wt 214.0 lb

## 2015-05-31 DIAGNOSIS — M1A09X0 Idiopathic chronic gout, multiple sites, without tophus (tophi): Secondary | ICD-10-CM | POA: Diagnosis not present

## 2015-05-31 DIAGNOSIS — E119 Type 2 diabetes mellitus without complications: Secondary | ICD-10-CM

## 2015-05-31 DIAGNOSIS — E785 Hyperlipidemia, unspecified: Secondary | ICD-10-CM

## 2015-05-31 DIAGNOSIS — Z114 Encounter for screening for human immunodeficiency virus [HIV]: Secondary | ICD-10-CM

## 2015-05-31 DIAGNOSIS — I10 Essential (primary) hypertension: Secondary | ICD-10-CM | POA: Diagnosis not present

## 2015-05-31 DIAGNOSIS — I2510 Atherosclerotic heart disease of native coronary artery without angina pectoris: Secondary | ICD-10-CM

## 2015-05-31 DIAGNOSIS — Z125 Encounter for screening for malignant neoplasm of prostate: Secondary | ICD-10-CM

## 2015-05-31 DIAGNOSIS — Z1159 Encounter for screening for other viral diseases: Secondary | ICD-10-CM

## 2015-05-31 LAB — COMPLETE METABOLIC PANEL WITH GFR
ALBUMIN: 4.4 g/dL (ref 3.6–5.1)
ALK PHOS: 78 U/L (ref 40–115)
ALT: 12 U/L (ref 9–46)
AST: 12 U/L (ref 10–35)
BUN: 17 mg/dL (ref 7–25)
CO2: 26 mmol/L (ref 20–31)
CREATININE: 1.03 mg/dL (ref 0.70–1.33)
Calcium: 10 mg/dL (ref 8.6–10.3)
Chloride: 99 mmol/L (ref 98–110)
GFR, Est African American: >89 mL/min (ref >=60)
GFR, Est Non African American: 80 mL/min (ref >=60)
Glucose, Bld: 154 mg/dL — ABNORMAL HIGH (ref 65–99)
Potassium: 4.5 mmol/L (ref 3.5–5.3)
Sodium: 137 mmol/L (ref 135–146)
Total Bilirubin: 0.5 mg/dL (ref 0.2–1.2)
Total Protein: 7.3 g/dL (ref 6.1–8.1)

## 2015-05-31 LAB — LIPID PANEL
CHOLESTEROL: 283 mg/dL — AB (ref 125–200)
HDL: 33 mg/dL — ABNORMAL LOW (ref >=40)
LDL Cholesterol: 197 mg/dL — ABNORMAL HIGH (ref <130)
TRIGLYCERIDES: 263 mg/dL — AB (ref <150)
Total CHOL/HDL Ratio: 8.6 Ratio — ABNORMAL HIGH (ref <=5.0)
VLDL: 53 mg/dL — AB (ref <30)

## 2015-05-31 LAB — URIC ACID: Uric Acid, Serum: 8.2 mg/dL — ABNORMAL HIGH (ref 4.0–7.8)

## 2015-05-31 LAB — POCT GLYCOSYLATED HEMOGLOBIN (HGB A1C): HEMOGLOBIN A1C: 7

## 2015-05-31 MED ORDER — METFORMIN HCL 500 MG PO TABS: 500.0000 mg | ORAL_TABLET | Freq: Two times a day (BID) | ORAL | Status: DC

## 2015-05-31 MED ORDER — ATORVASTATIN CALCIUM 40 MG PO TABS: 40.0000 mg | ORAL_TABLET | Freq: Every day | ORAL | Status: DC

## 2015-05-31 MED ORDER — ALLOPURINOL 300 MG PO TABS: 300.0000 mg | ORAL_TABLET | Freq: Every day | ORAL | Status: DC

## 2015-05-31 NOTE — Progress Notes (Signed)
   Subjective:    Patient ID: Steven Estrada, male    DOB: [birth year], 59 y.o.   MRN: [id number]  HPI Diabetes - no hypoglycemic events. No wounds or sores that are not healing well. No increased thirst or urination. Checking glucose at home. Taking medications as prescribed without any side effects. Currently on metformin 500 mg twice a day.  Hypertension- Pt denies chest pain, SOB, dizziness, or heart palpitations.  Taking meds as directed w/o problems.  Denies medication side effects.    Gout - last uric acid level was 8.4 year ago. He is on allopurinol.    CAD- sees Dr. Chales Abrahams.  Wants to know if can take cialis on the metoprolol. Has question about  His statin as well. Says has read a lot about it and has some questions. He has been off it for awhile.     Review of Systems     Objective:   Physical Exam  Constitutional: He is oriented to person, place, and time. He appears well-developed and well-nourished.  HENT:  Head: Normocephalic and atraumatic.  Cardiovascular: Normal rate, regular rhythm and normal heart sounds.   Pulmonary/Chest: Effort normal and breath sounds normal.  Neurological: He is alert and oriented to person, place, and time.  Skin: Skin is warm and dry.  Psychiatric: He has a normal mood and affect. His behavior is normal.          Assessment & Plan:  DM- 11 A1c of 7.0 today. It went up a little bit from last time. He is on a statin and ARB.  HTN - repeat blood pressure looks fantastic today. Continue current regimen.  Gout-due to recheck uric acid level.  CAD- discussed the importance of taking a statin. Discussed the risks and benefits.  He says he is willing to restart the medication. New rx sent.

## 2015-06-01 ENCOUNTER — Other Ambulatory Visit: Payer: Self-pay | Admitting: Family Medicine

## 2015-06-01 ENCOUNTER — Other Ambulatory Visit: Payer: Self-pay | Admitting: *Deleted

## 2015-06-01 DIAGNOSIS — M1A09X0 Idiopathic chronic gout, multiple sites, without tophus (tophi): Secondary | ICD-10-CM

## 2015-06-01 DIAGNOSIS — E785 Hyperlipidemia, unspecified: Secondary | ICD-10-CM

## 2015-06-01 LAB — HEPATITIS C ANTIBODY: HCV Ab: NEGATIVE

## 2015-06-01 LAB — HIV ANTIBODY (ROUTINE TESTING W REFLEX): HIV 1&2 Ab, 4th Generation: NONREACTIVE

## 2015-06-01 LAB — PSA: PSA: 0.65 ng/mL (ref <=4.00)

## 2015-06-01 MED ORDER — ALLOPURINOL 300 MG PO TABS: 450.0000 mg | ORAL_TABLET | Freq: Every day | ORAL | Status: DC

## 2015-08-30 ENCOUNTER — Ambulatory Visit: Payer: BLUE CROSS/BLUE SHIELD | Admitting: Family Medicine

## 2015-08-30 LAB — HM DIABETES EYE EXAM

## 2015-08-31 ENCOUNTER — Other Ambulatory Visit: Payer: Self-pay | Admitting: *Deleted

## 2015-08-31 MED ORDER — OLMESARTAN-AMLODIPINE-HCTZ 40-10-25 MG PO TABS: 1.0000 | ORAL_TABLET | Freq: Every day | ORAL | Status: DC

## 2015-09-01 ENCOUNTER — Encounter: Payer: Self-pay | Admitting: Physician Assistant

## 2015-09-01 ENCOUNTER — Ambulatory Visit (INDEPENDENT_AMBULATORY_CARE_PROVIDER_SITE_OTHER): Payer: BLUE CROSS/BLUE SHIELD | Admitting: Physician Assistant

## 2015-09-01 VITALS — BP 142/94 | HR 80 | Ht 69.0 in | Wt 214.0 lb

## 2015-09-01 DIAGNOSIS — I10 Essential (primary) hypertension: Secondary | ICD-10-CM

## 2015-09-01 DIAGNOSIS — R109 Unspecified abdominal pain: Secondary | ICD-10-CM | POA: Diagnosis not present

## 2015-09-01 LAB — POCT URINALYSIS DIPSTICK
Bilirubin, UA: NEGATIVE
Blood, UA: NEGATIVE
GLUCOSE UA: NEGATIVE
Ketones, UA: NEGATIVE
LEUKOCYTES UA: NEGATIVE
Nitrite, UA: NEGATIVE
Protein, UA: NEGATIVE
SPEC GRAV UA: 1.02
UROBILINOGEN UA: 1
pH, UA: 7.5

## 2015-09-01 NOTE — Progress Notes (Signed)
   Subjective:    Patient ID: Steven Estrada, male    DOB: November 26, 1955, 59 y.o.   MRN: [id number]  HPI Patient is a 59 year old male who presents to the clinic with elevated blood pressure. He has been out of his Tribenzor or for 4-5 days. BP gotten as high as 200/100. He recently started back yesterday morning on Tribenzor. He denies any chest pains, palpitations, dizziness or headaches. His blood pressure is still elevated today despite taking his medication. He is having some bilateral flank pain after urination. He wonders if there could be an infection. He denies any fever, chills, painful urination, nausea or vomiting. He has no abdominal pain.   Review of Systems  All other systems reviewed and are negative.      Objective:   Physical Exam  Constitutional: He is oriented to person, place, and time. He appears well-developed and well-nourished.  Cardiovascular: Normal rate, regular rhythm and normal heart sounds.   Pulmonary/Chest: Effort normal and breath sounds normal.  No CVA tenderness to palpation.   Abdominal: Soft. Bowel sounds are normal. He exhibits no distension and no mass. There is no tenderness. There is no rebound and no guarding.  Neurological: He is alert and oriented to person, place, and time.  Skin: Skin is dry.  Psychiatric: He has a normal mood and affect. His behavior is normal.          Assessment & Plan:  Hypertension-rechecked and blood pressure did go down some in office today. Was 142/94. Patient has not been on his medication regularly for the last week. Discussed recheck of blood pressure after taking daily with PCP in 2 weeks.  Flank pain, bilateral-.Marland Kitchen. Results for orders placed or performed in visit on 09/01/15  POCT urinalysis dipstick  Result Value Ref Range   Color, UA yellow    Clarity, UA clear    Glucose, UA neg    Bilirubin, UA neg    Ketones, UA neg    Spec Grav, UA 1.020    Blood, UA neg    pH, UA 7.5    Protein, UA neg    Urobilinogen, UA 1.0    Nitrite, UA neg    Leukocytes, UA Negative Negative   There was no blood, nitrates, leukocytes and urine today. Will culture. I do not think urinary tract infection is causing BP elevation. If symptoms continue or worsen please follow-up. Flank pain could likely be due to musculoskeletal strain or spasm. Patient can use warm compresses on back with some ibuprofen use.

## 2015-09-03 LAB — URINE CULTURE
Colony Count: NO GROWTH
Organism ID, Bacteria: NO GROWTH

## 2015-09-06 ENCOUNTER — Telehealth: Payer: Self-pay | Admitting: Emergency Medicine

## 2015-09-06 NOTE — Telephone Encounter (Signed)
Patient called to report ongoing high blood pressure readings, including today when he went for cardiac stress test. He has been taking rxs for HTN without success. Appt. given to him for 09-09-15 at 2:15pm with Dr.Metheney. PAK

## 2015-09-09 ENCOUNTER — Ambulatory Visit (INDEPENDENT_AMBULATORY_CARE_PROVIDER_SITE_OTHER): Payer: BLUE CROSS/BLUE SHIELD | Admitting: Family Medicine

## 2015-09-09 ENCOUNTER — Encounter: Payer: Self-pay | Admitting: Family Medicine

## 2015-09-09 VITALS — BP 129/76 | HR 111 | Temp 98.5°F | Ht 69.0 in | Wt 217.5 lb

## 2015-09-09 DIAGNOSIS — I10 Essential (primary) hypertension: Secondary | ICD-10-CM

## 2015-09-09 DIAGNOSIS — Z23 Encounter for immunization: Secondary | ICD-10-CM

## 2015-09-09 DIAGNOSIS — E119 Type 2 diabetes mellitus without complications: Secondary | ICD-10-CM | POA: Diagnosis not present

## 2015-09-09 DIAGNOSIS — I2510 Atherosclerotic heart disease of native coronary artery without angina pectoris: Secondary | ICD-10-CM | POA: Diagnosis not present

## 2015-09-09 LAB — POCT UA - MICROALBUMIN
Albumin/Creatinine Ratio, Urine, POC: <30
CREATININE, POC: 30 mg/dL
MICROALBUMIN (UR) POC: 80 mg/L

## 2015-09-09 LAB — POCT GLYCOSYLATED HEMOGLOBIN (HGB A1C): Hemoglobin A1C: 6.6

## 2015-09-09 MED ORDER — NITROGLYCERIN 0.4 MG SL SUBL: 0.4000 mg | SUBLINGUAL_TABLET | SUBLINGUAL | Status: DC | PRN

## 2015-09-09 MED ORDER — OLMESARTAN-AMLODIPINE-HCTZ 40-10-25 MG PO TABS: 1.0000 | ORAL_TABLET | Freq: Every day | ORAL | Status: DC

## 2015-09-09 NOTE — Progress Notes (Signed)
   Subjective:    Patient ID: Steven Estrada, male    DOB: July 31, 1956, 59 y.o.   MRN: [id number]  HPI Diabetes - no hypoglycemic events. No wounds or sores that are not healing well. No increased thirst or urination. Checking glucose at home. Taking medications as prescribed without any side effects.  Hypertension- Pt denies chest pain, SOB, dizziness, or heart palpitations.  Taking meds as directed w/o problems.  Denies medication side effects.     CAD- no recent CP or SOB.  Says he doesn't really have any NTG. Hasn't needed it in about 2 years.    Review of Systems     Objective:   Physical Exam  Constitutional: He is oriented to person, place, and time. He appears well-developed and well-nourished.  HENT:  Head: Normocephalic and atraumatic.  Cardiovascular: Normal rate, regular rhythm and normal heart sounds.   Pulmonary/Chest: Effort normal and breath sounds normal.  Neurological: He is alert and oriented to person, place, and time.  Skin: Skin is warm and dry.  Psychiatric: He has a normal mood and affect. His behavior is normal.          Assessment & Plan:  DM- well controlled. Continue current regimen. F/U in 3 months   Lab Results  Component Value Date   HGBA1C 6.6 09/09/2015    Foot exam done today.  Urine micro done.  Eye exam done last Monday. Flu vaccine given today.   HTN - well controlled now he is back on medication.  CAD- new Rx sent for NTG. Asymptomatic.

## 2015-09-27 ENCOUNTER — Ambulatory Visit: Payer: BLUE CROSS/BLUE SHIELD | Admitting: Family Medicine

## 2015-12-13 ENCOUNTER — Ambulatory Visit: Payer: BLUE CROSS/BLUE SHIELD | Admitting: Family Medicine

## 2015-12-20 ENCOUNTER — Ambulatory Visit: Payer: BLUE CROSS/BLUE SHIELD | Admitting: Family Medicine

## 2016-01-10 ENCOUNTER — Ambulatory Visit: Payer: BLUE CROSS/BLUE SHIELD | Admitting: Family Medicine

## 2016-01-31 ENCOUNTER — Ambulatory Visit: Payer: BLUE CROSS/BLUE SHIELD | Admitting: Family Medicine

## 2016-02-26 ENCOUNTER — Other Ambulatory Visit: Payer: Self-pay | Admitting: Family Medicine

## 2016-02-27 ENCOUNTER — Other Ambulatory Visit: Payer: Self-pay | Admitting: Family Medicine

## 2016-02-29 ENCOUNTER — Other Ambulatory Visit: Payer: Self-pay | Admitting: *Deleted

## 2016-02-29 MED ORDER — METFORMIN HCL 500 MG PO TABS: 500.0000 mg | ORAL_TABLET | Freq: Two times a day (BID) | ORAL | Status: DC

## 2016-02-29 NOTE — Addendum Note (Signed)
Addended by: Deno EtienneBARKLEY, Farha Dano L on: 02/29/2016 11:48 AM   Modules accepted: Orders

## 2016-03-02 ENCOUNTER — Encounter: Payer: Self-pay | Admitting: Family Medicine

## 2016-03-06 ENCOUNTER — Ambulatory Visit (INDEPENDENT_AMBULATORY_CARE_PROVIDER_SITE_OTHER): Payer: BLUE CROSS/BLUE SHIELD | Admitting: Family Medicine

## 2016-03-06 ENCOUNTER — Encounter: Payer: Self-pay | Admitting: Family Medicine

## 2016-03-06 VITALS — BP 124/80 | HR 81 | Wt 217.0 lb

## 2016-03-06 DIAGNOSIS — E785 Hyperlipidemia, unspecified: Secondary | ICD-10-CM | POA: Diagnosis not present

## 2016-03-06 DIAGNOSIS — Z125 Encounter for screening for malignant neoplasm of prostate: Secondary | ICD-10-CM

## 2016-03-06 DIAGNOSIS — M1A09X0 Idiopathic chronic gout, multiple sites, without tophus (tophi): Secondary | ICD-10-CM

## 2016-03-06 DIAGNOSIS — E119 Type 2 diabetes mellitus without complications: Secondary | ICD-10-CM

## 2016-03-06 DIAGNOSIS — I10 Essential (primary) hypertension: Secondary | ICD-10-CM | POA: Diagnosis not present

## 2016-03-06 LAB — POCT GLYCOSYLATED HEMOGLOBIN (HGB A1C): HEMOGLOBIN A1C: 6.8

## 2016-03-06 NOTE — Progress Notes (Signed)
Subjective:    Patient ID: Steven Estrada, male    DOB: 01-29-1956, 60 y.o.   MRN: [id number]  HPI Diabetes - no hypoglycemic events. No wounds or sores that are not healing well. No increased thirst or urination. Checking glucose at home. Taking medications as prescribed without any side effects.  Hypertension- Pt denies chest pain, SOB, dizziness, or heart palpitations.  Taking meds as directed w/o problems.  Denies medication side effects.    Gout -  Has diffuse taking allopurinol every day and Steven Estrada says Steven Estrada just takes it when Steven Estrada has a gout flare Steven Estrada keeps in his truck. Steven Estrada has not had a flare since I saw him about 6 months ago.  Steven Estrada does have athlete's foot and wants to know what would be the best thing to treat it with. Steven Estrada has not tried anything over-the-counter yet. Steven Estrada just noticed a few spots.   Review of Systems BP 124/80 mmHg  Pulse 81  Wt 217 lb (98.431 kg)  SpO2 100%    No Known Allergies  Past Medical History  Diagnosis Date  . Hyperlipidemia   . Hypertension   . Hx of cardiovascular stress test     a. ETT-Myoview 3/14:  EF 56%, + inferior ischemia  . Diabetes mellitus without complication [facility])     Past Surgical History  Procedure Laterality Date  . Coronary angioplasty with stent placement  04/2013    Dr. Chales Abrahams at [facility]  . Left heart catheterization with coronary angiogram N/A 01/31/2013    Procedure: LEFT HEART CATHETERIZATION WITH CORONARY ANGIOGRAM;  Surgeon: Peter M Swaziland, MD;  Location: [facility] CATH LAB;  Service: Cardiovascular;  Laterality: N/A;    Social History   Social History  . Marital Status: Divorced    Spouse Name: N/A  . Number of Children: 1  . Years of Education: N/A   Occupational History  . TRUCK DRIVER    Social History Main Topics  . Smoking status: Never Smoker   . Smokeless tobacco: Never Used  . Alcohol Use: Yes     Comment: 12 per month  . Drug Use: No  . Sexual Activity: Not on file   Other Topics Concern  . Not on file    Social History Narrative   Works out [employer].     Family History  Problem Relation Age of Onset  . Hypertension Sister   . Hypertension Brother   . Hypertension Sister   . Heart failure Mother 81    Outpatient Encounter Prescriptions as of 03/06/2016  Medication Sig  . allopurinol (ZYLOPRIM) 300 MG tablet Take 1.5 tablets (450 mg total) by mouth daily.  Marland Kitchen aspirin 81 MG tablet Take 81 mg by mouth daily.  Marland Kitchen atorvastatin (LIPITOR) 40 MG tablet Take 1 tablet (40 mg total) by mouth daily.  . clopidogrel (PLAVIX) 75 MG tablet   . fish oil-omega-3 fatty acids 1000 MG capsule Take 1 g by mouth daily.  . metFORMIN (GLUCOPHAGE) 500 MG tablet Take 1 tablet (500 mg total) by mouth 2 (two) times daily with a meal. *NO ADDITIONAL REFILLS WILL BE GIVEN. PLEASE CALL THE OFFICE TO SCHEDULE AN APPOINTMENT*  . metoprolol tartrate (LOPRESSOR) 25 MG tablet TAKE 1 TABLET TWICE A DAY  . nitroGLYCERIN (NITROSTAT) 0.4 MG SL tablet Place 1 tablet (0.4 mg total) under the tongue every 5 (five) minutes as needed for chest pain.  . NON FORMULARY Glucometer, strips, and lancets to check dailly   . Olmesartan-Amlodipine-HCTZ (TRIBENZOR) 40-10-25 MG TABS  Take 1 tablet by mouth daily.  . prasugrel (EFFIENT) 5 MG TABS tablet Take 2 tablets (10 mg total) by mouth daily.  . tadalafil (CIALIS) 20 MG tablet Take 20 mg by mouth daily as needed for erectile dysfunction.    No facility-administered encounter medications on file as of 03/06/2016.          Objective:   Physical Exam  Constitutional: Steven Estrada is oriented to person, place, and time. Steven Estrada appears well-developed and well-nourished.  HENT:  Head: Normocephalic and atraumatic.  Cardiovascular: Normal rate, regular rhythm and normal heart sounds.   Pulmonary/Chest: Effort normal and breath sounds normal.  Neurological: Steven Estrada is alert and oriented to person, place, and time.  Skin: Skin is warm and dry.  Some dry peeling skin around edge of heals. Some darkened skin  color between the toes.   Psychiatric: Steven Estrada has a normal mood and affect. His behavior is normal.        Assessment & Plan:  DM-Well-controlled. Hemoglobin A1c of 6.8 today that that is up a little bit from previous of 6.60 encouraged him to make sure Steven Estrada is getting back on track with diet and exercise. on statin, ARB, and ASA. Eye exam is up-to-date. Lab Results  Component Value Date   HGBA1C 6.6 09/09/2015    HTN- Well controlled. Continue current regimen. Follow up in 3-4 months.   Tinea pedis-okay to use over-the-counter Lamisil in either the cream or fungal version. Reminded him to make sure to take it for next week or 2 once the rash completely clears to remove any spores from the skin. Next  Gout-no recent flares but did explain to him that Steven Estrada actually needs to be taking allopurinol every day to help prevent future destruction of the joints. Instead of using it more as a rescue medication. Next  Steven Estrada did by several over-the-counter supplements including vitamin D, vitamin B, and vitamin K. Encouraged him not to take the vitamin K because of his Plavix and aspirin.

## 2016-03-07 LAB — COMPLETE METABOLIC PANEL WITH GFR
ALBUMIN: 4.7 g/dL (ref 3.6–5.1)
ALK PHOS: 74 U/L (ref 40–115)
ALT: 27 U/L (ref 9–46)
AST: 19 U/L (ref 10–35)
BUN: 15 mg/dL (ref 7–25)
CHLORIDE: 98 mmol/L (ref 98–110)
CO2: 26 mmol/L (ref 20–31)
Calcium: 9.8 mg/dL (ref 8.6–10.3)
Creat: 0.98 mg/dL (ref 0.70–1.33)
GFR, EST NON AFRICAN AMERICAN: 85 mL/min (ref >=60)
GFR, Est African American: >89 mL/min (ref >=60)
GLUCOSE: 123 mg/dL — AB (ref 65–99)
POTASSIUM: 4.1 mmol/L (ref 3.5–5.3)
SODIUM: 137 mmol/L (ref 135–146)
Total Bilirubin: 0.5 mg/dL (ref 0.2–1.2)
Total Protein: 7.2 g/dL (ref 6.1–8.1)

## 2016-03-07 LAB — LIPID PANEL
CHOL/HDL RATIO: 5.6 ratio — AB (ref <=5.0)
CHOLESTEROL: 163 mg/dL (ref 125–200)
HDL: 29 mg/dL — AB (ref >=40)
LDL Cholesterol: 105 mg/dL (ref <130)
Triglycerides: 147 mg/dL (ref <150)
VLDL: 29 mg/dL (ref <30)

## 2016-03-07 LAB — URIC ACID: URIC ACID, SERUM: 8.8 mg/dL — AB (ref 4.0–7.8)

## 2016-03-07 LAB — PSA: PSA: 0.55 ng/mL (ref <=4.00)

## 2016-04-06 ENCOUNTER — Other Ambulatory Visit: Payer: Self-pay | Admitting: Family Medicine

## 2016-05-29 ENCOUNTER — Other Ambulatory Visit: Payer: Self-pay | Admitting: *Deleted

## 2016-06-12 ENCOUNTER — Ambulatory Visit: Payer: BLUE CROSS/BLUE SHIELD | Admitting: Family Medicine

## 2016-07-03 ENCOUNTER — Ambulatory Visit: Payer: BLUE CROSS/BLUE SHIELD | Admitting: Family Medicine

## 2016-07-03 NOTE — Progress Notes (Deleted)
Subjective:    CC:   HPI:  Diabetes - no hypoglycemic events. No wounds or sores that are not healing well. No increased thirst or urination. Checking glucose at home. Taking medications as prescribed without any side effects.  Hypertension- Pt denies chest pain, SOB, dizziness, or heart palpitations.  Taking meds as directed w/o problems.  Denies medication side effects.    CAD -   Past medical history, Surgical history, Family history not pertinant except as noted below, Social history, Allergies, and medications have been entered into the medical record, reviewed, and corrections made.   Review of Systems: No fevers, chills, night sweats, weight loss, chest pain, or shortness of breath.   Objective:    General: Well Developed, well nourished, and in no acute distress.  Neuro: Alert and oriented x3, extra-ocular muscles intact, sensation grossly intact.  HEENT: Normocephalic, atraumatic  Skin: Warm and dry, no rashes. Cardiac: Regular rate and rhythm, no murmurs rubs or gallops, no lower extremity edema.  Respiratory: Clear to auscultation bilaterally. Not using accessory muscles, speaking in full sentences.   Impression and Recommendations:    DM -   HTN -   CAD -

## 2016-07-09 ENCOUNTER — Other Ambulatory Visit: Payer: Self-pay | Admitting: Family Medicine

## 2016-07-24 ENCOUNTER — Other Ambulatory Visit: Payer: Self-pay | Admitting: Family Medicine

## 2016-07-31 ENCOUNTER — Ambulatory Visit: Payer: BLUE CROSS/BLUE SHIELD | Admitting: Family Medicine

## 2016-08-04 ENCOUNTER — Other Ambulatory Visit: Payer: Self-pay | Admitting: Family Medicine

## 2016-08-14 ENCOUNTER — Ambulatory Visit: Payer: BLUE CROSS/BLUE SHIELD | Admitting: Family Medicine

## 2016-08-21 ENCOUNTER — Ambulatory Visit (INDEPENDENT_AMBULATORY_CARE_PROVIDER_SITE_OTHER): Payer: BLUE CROSS/BLUE SHIELD | Admitting: Family Medicine

## 2016-08-21 ENCOUNTER — Encounter: Payer: Self-pay | Admitting: Family Medicine

## 2016-08-21 VITALS — BP 140/85 | HR 91 | Ht 69.0 in | Wt 251.0 lb

## 2016-08-21 DIAGNOSIS — Z23 Encounter for immunization: Secondary | ICD-10-CM | POA: Diagnosis not present

## 2016-08-21 DIAGNOSIS — M1A09X0 Idiopathic chronic gout, multiple sites, without tophus (tophi): Secondary | ICD-10-CM

## 2016-08-21 DIAGNOSIS — M5442 Lumbago with sciatica, left side: Secondary | ICD-10-CM

## 2016-08-21 DIAGNOSIS — E119 Type 2 diabetes mellitus without complications: Secondary | ICD-10-CM

## 2016-08-21 DIAGNOSIS — I2510 Atherosclerotic heart disease of native coronary artery without angina pectoris: Secondary | ICD-10-CM

## 2016-08-21 DIAGNOSIS — I10 Essential (primary) hypertension: Secondary | ICD-10-CM

## 2016-08-21 LAB — POCT GLYCOSYLATED HEMOGLOBIN (HGB A1C): Hemoglobin A1C: 6.9

## 2016-08-21 MED ORDER — METFORMIN HCL 1000 MG PO TABS: 1000.0000 mg | ORAL_TABLET | Freq: Two times a day (BID) | ORAL | 1 refills | Status: DC

## 2016-08-21 NOTE — Progress Notes (Signed)
Subjective:    CC: DM  HPI: Diabetes - no hypoglycemic events. No wounds or sores that are not healing well. No increased thirst or urination. Checking glucose at home. Taking medications as prescribed without any side effects.  Hypertension- Pt denies chest pain, SOB, dizziness, or heart palpitations.  Taking meds as directed w/o problems.  Denies medication side effects.    Gout - We last checked his uric acid level in May and was elevated at 8.8. He had actually stopped taking his uric acid but said he was going to restart it. No gout flares since I last saw him.  Hyperlipidemia-his cardiologist just recently increased his Lipitor to 80 mg. He says he just started it today.  He's also been having some left-sided low back pain that radiates down to the outer thigh to his knee. He says the first episode started about 3 weeks ago. Each episode only lasts about 2-3 days but it's happened about 3 times since then. He denies any known injury or trauma. He said he did try some Aspercreme rub without any significant relief.  Past medical history, Surgical history, Family history not pertinant except as noted below, Social history, Allergies, and medications have been entered into the medical record, reviewed, and corrections made.   Review of Systems: No fevers, chills, night sweats, weight loss, chest pain, or shortness of breath.    Objective:    General: Well Developed, well nourished, and in no acute distress.  Neuro: Alert and oriented x3, extra-ocular muscles intact, sensation grossly intact.  HEENT: Normocephalic, atraumatic  Skin: Warm and dry, no rashes. Cardiac: Regular rate and rhythm, no murmurs rubs or gallops, no lower extremity edema.  Respiratory: Clear to auscultation bilaterally. Not using accessory muscles, speaking in full sentences. MSK: Normal lumbar flexion, extension, rotation right and left side bending. Negative straight leg raise bilaterally. Hip, knee, ankle strength  is 5 out of 5 bilaterally. Patellar reflexes 1+ bilaterally. Nontender over the lumbar spine. Nontender over the SI joints.   Impression and Recommendations:    DM - A1C 6.9.  Well controlled. Continue current regimen. Follow up in  3 mo.   Hypertension- Pt denies chest pain, SOB, dizziness, or heart palpitations.  Taking meds as directed w/o problems.  Denies medication side effects.    Gout - due to check uric acid.   Left low back pain with sciatica - pain symptoms most consistent with sciatica. Given handout with low back stretches. Recommend an anti-inflammatory when needed. He can certainly stop his aspirin for couple days while he takes this.  Work on gentle stretches. Call if episodes continue to occur.  Hyperlipidemia-updated his chart. Now on 80 mg of Lipitor. Encouraged him to monitor for any increased muscle aches or pains and to let us know.

## 2016-08-28 ENCOUNTER — Other Ambulatory Visit: Payer: Self-pay | Admitting: Family Medicine

## 2016-09-29 ENCOUNTER — Other Ambulatory Visit: Payer: Self-pay | Admitting: Family Medicine

## 2016-11-20 ENCOUNTER — Ambulatory Visit: Payer: BLUE CROSS/BLUE SHIELD | Admitting: Family Medicine

## 2016-11-20 NOTE — Progress Notes (Deleted)
Subjective:    CC: DM, HTN  HPI: Hypertension- Pt denies chest pain, SOB, dizziness, or heart palpitations.  Taking meds as directed w/o problems.  Denies medication side effects.    Diabetes - no hypoglycemic events. No wounds or sores that are not healing well. No increased thirst or urination. Checking glucose at home. Taking medications as prescribed without any side effects.   Past medical history, Surgical history, Family history not pertinant except as noted below, Social history, Allergies, and medications have been entered into the medical record, reviewed, and corrections made.   Review of Systems: No fevers, chills, night sweats, weight loss, chest pain, or shortness of breath.   Objective:    General: Well Developed, well nourished, and in no acute distress.  Neuro: Alert and oriented x3, extra-ocular muscles intact, sensation grossly intact.  HEENT: Normocephalic, atraumatic  Skin: Warm and dry, no rashes. Cardiac: Regular rate and rhythm, no murmurs rubs or gallops, no lower extremity edema.  Respiratory: Clear to auscultation bilaterally. Not using accessory muscles, speaking in full sentences.   Impression and Recommendations:    HTN -   DM -

## 2016-12-05 ENCOUNTER — Telehealth: Payer: Self-pay

## 2016-12-05 MED ORDER — OLMESARTAN MEDOXOMIL-HCTZ 40-25 MG PO TABS: 1.0000 | ORAL_TABLET | Freq: Every day | ORAL | 1 refills | Status: DC

## 2016-12-05 MED ORDER — AMLODIPINE BESYLATE 10 MG PO TABS: 10.0000 mg | ORAL_TABLET | Freq: Every day | ORAL | 1 refills | Status: DC

## 2016-12-05 NOTE — Telephone Encounter (Signed)
New Rx sent to the pharmacy.  Nani Gasseratherine Annamarie Yamaguchi, MD

## 2016-12-05 NOTE — Telephone Encounter (Signed)
[facility] insurance will no longer pay for Tribenzor. They will pay for Benicar-HCTZ and Amlodipine separate. Please advise.

## 2016-12-06 NOTE — Telephone Encounter (Signed)
Patient advised.

## 2017-04-03 ENCOUNTER — Other Ambulatory Visit: Payer: Self-pay | Admitting: Family Medicine

## 2017-04-03 LAB — HM DIABETES EYE EXAM

## 2017-04-13 ENCOUNTER — Encounter: Payer: Self-pay | Admitting: Family Medicine

## 2017-05-07 ENCOUNTER — Ambulatory Visit (INDEPENDENT_AMBULATORY_CARE_PROVIDER_SITE_OTHER): Payer: PRIVATE HEALTH INSURANCE | Admitting: Family Medicine

## 2017-05-07 ENCOUNTER — Other Ambulatory Visit: Payer: Self-pay | Admitting: Family Medicine

## 2017-05-07 VITALS — BP 133/77 | HR 83 | Resp 18 | Wt 214.5 lb

## 2017-05-07 DIAGNOSIS — E119 Type 2 diabetes mellitus without complications: Secondary | ICD-10-CM | POA: Diagnosis not present

## 2017-05-07 DIAGNOSIS — M1A09X0 Idiopathic chronic gout, multiple sites, without tophus (tophi): Secondary | ICD-10-CM

## 2017-05-07 DIAGNOSIS — I2510 Atherosclerotic heart disease of native coronary artery without angina pectoris: Secondary | ICD-10-CM | POA: Diagnosis not present

## 2017-05-07 DIAGNOSIS — I10 Essential (primary) hypertension: Secondary | ICD-10-CM | POA: Diagnosis not present

## 2017-05-07 DIAGNOSIS — E7800 Pure hypercholesterolemia, unspecified: Secondary | ICD-10-CM

## 2017-05-07 DIAGNOSIS — Z125 Encounter for screening for malignant neoplasm of prostate: Secondary | ICD-10-CM

## 2017-05-07 DIAGNOSIS — M5416 Radiculopathy, lumbar region: Secondary | ICD-10-CM

## 2017-05-07 LAB — COMPLETE METABOLIC PANEL WITH GFR
ALT: 25 U/L (ref 9–46)
AST: 18 U/L (ref 10–35)
Albumin: 4.4 g/dL (ref 3.6–5.1)
Alkaline Phosphatase: 67 U/L (ref 40–115)
BUN: 16 mg/dL (ref 7–25)
CALCIUM: 9.8 mg/dL (ref 8.6–10.3)
CHLORIDE: 101 mmol/L (ref 98–110)
CO2: 24 mmol/L (ref 20–31)
Creat: 0.96 mg/dL (ref 0.70–1.33)
GFR, Est African American: >89 mL/min (ref >=60)
GFR, Est Non African American: 86 mL/min (ref >=60)
GLUCOSE: 140 mg/dL — AB (ref 65–99)
POTASSIUM: 4.1 mmol/L (ref 3.5–5.3)
SODIUM: 138 mmol/L (ref 135–146)
Total Bilirubin: 0.5 mg/dL (ref 0.2–1.2)
Total Protein: 7.3 g/dL (ref 6.1–8.1)

## 2017-05-07 LAB — LIPID PANEL
CHOL/HDL RATIO: 4.7 ratio (ref <5.0)
CHOLESTEROL: 147 mg/dL (ref <200)
HDL: 31 mg/dL — ABNORMAL LOW (ref >40)
LDL CALC: 93 mg/dL (ref <100)
Triglycerides: 116 mg/dL (ref <150)
VLDL: 23 mg/dL (ref <30)

## 2017-05-07 LAB — POCT GLYCOSYLATED HEMOGLOBIN (HGB A1C): HEMOGLOBIN A1C: 6.8

## 2017-05-07 MED ORDER — ALLOPURINOL 300 MG PO TABS: ORAL_TABLET | ORAL | 3 refills | Status: DC

## 2017-05-07 MED ORDER — SITAGLIP PHOS-METFORMIN HCL ER 50-500 MG PO TB24: 1.0000 | ORAL_TABLET | Freq: Every day | ORAL | 5 refills | Status: DC

## 2017-05-07 MED ORDER — AMLODIPINE BESYLATE 10 MG PO TABS: 10.0000 mg | ORAL_TABLET | Freq: Every day | ORAL | 1 refills | Status: DC

## 2017-05-07 MED ORDER — PREDNISONE 20 MG PO TABS: 40.0000 mg | ORAL_TABLET | Freq: Every day | ORAL | 0 refills | Status: DC

## 2017-05-07 MED ORDER — OLMESARTAN MEDOXOMIL-HCTZ 40-25 MG PO TABS: 1.0000 | ORAL_TABLET | Freq: Every day | ORAL | 1 refills | Status: DC

## 2017-05-07 NOTE — Progress Notes (Signed)
Subjective:    CC: DM  HPI:  Diabetes - no hypoglycemic events. No wounds or sores that are not healing well. No increased thirst or urination. Checking glucose at home. He's concerned an increase on the metformin has been causing some GI upset. He says it makes him not want to eat.  Hypertension- Pt denies chest pain, SOB, dizziness, or heart palpitations.  Taking meds as directed w/o problems.  Denies medication side effects.    CAD - No recent chest pain or shortness of breath.  Gout - no recent flares. Currently on allopurinol.  Hyperlipidemia- tolerating increased dose well.  He also complains of left-sided sciatica. He said it started bothering him a couple days ago over the weekend. He denies any heavy lifting or known trauma but says he was doing more walking than usual. At first he thought maybe it was just some muscle tightness and spasm and thought maybe he was a little dehydrated but now he is getting the radiation down his left leg to his knee and up towards his shoulder. He's had this previously.  Past medical history, Surgical history, Family history not pertinant except as noted below, Social history, Allergies, and medications have been entered into the medical record, reviewed, and corrections made.   Review of Systems: No fevers, chills, night sweats, weight loss, chest pain, or shortness of breath.   Objective:    General: Well Developed, well nourished, and in no acute distress.  Neuro: Alert and oriented x3, extra-ocular muscles intact, sensation grossly intact.  HEENT: Normocephalic, atraumatic  Skin: Warm and dry, no rashes. Cardiac: Regular rate and rhythm, no murmurs rubs or gallops, no lower extremity edema.  Respiratory: Clear to auscultation bilaterally. Not using accessory muscles, speaking in full sentences.   Impression and Recommendations:    DM- Well-controlled. Hemoglobin A1c of 6.8 today but increased on the metformin has caused some GI upset.  We'll decrease the metformin component back down to 500 mg and add Januvia.Foot exam performed today.  HTN - Well controlled. Continue current regimen. Follow up in  6 months.   Gout - stable. No recent flares.  CAD- stable. He actually has his yearly follow-up with cardiology this afternoon at 2:30.  Hyper lipidemia-due to recheck lipid panel.  Lumbar radiculopathy, left side-recommend a trial of prednisone. Discussed side effects.   Take with food and water.

## 2017-05-08 LAB — PSA: PSA: 0.7 ng/mL (ref <=4.0)

## 2017-05-25 ENCOUNTER — Other Ambulatory Visit: Payer: Self-pay | Admitting: Family Medicine

## 2017-08-13 ENCOUNTER — Ambulatory Visit (INDEPENDENT_AMBULATORY_CARE_PROVIDER_SITE_OTHER): Payer: PRIVATE HEALTH INSURANCE | Admitting: Family Medicine

## 2017-08-13 ENCOUNTER — Encounter: Payer: Self-pay | Admitting: Family Medicine

## 2017-08-13 VITALS — BP 130/55 | HR 84 | Ht 69.0 in | Wt 218.0 lb

## 2017-08-13 DIAGNOSIS — I2510 Atherosclerotic heart disease of native coronary artery without angina pectoris: Secondary | ICD-10-CM

## 2017-08-13 DIAGNOSIS — E119 Type 2 diabetes mellitus without complications: Secondary | ICD-10-CM

## 2017-08-13 DIAGNOSIS — I10 Essential (primary) hypertension: Secondary | ICD-10-CM

## 2017-08-13 DIAGNOSIS — Z23 Encounter for immunization: Secondary | ICD-10-CM

## 2017-08-13 LAB — POCT GLYCOSYLATED HEMOGLOBIN (HGB A1C): HEMOGLOBIN A1C: 8.1

## 2017-08-13 MED ORDER — ATORVASTATIN CALCIUM 80 MG PO TABS: 80.0000 mg | ORAL_TABLET | Freq: Every day | ORAL | 3 refills | Status: DC

## 2017-08-13 MED ORDER — METOPROLOL TARTRATE 25 MG PO TABS: 25.0000 mg | ORAL_TABLET | Freq: Two times a day (BID) | ORAL | 3 refills | Status: DC

## 2017-08-13 NOTE — Progress Notes (Signed)
Subjective:    CC:   HPI:  Diabetes - no hypoglycemic events. No wounds or sores that are not healing well. No increased thirst or urination. Checking glucose at home. Taking medications as prescribed without any side effects. He is still just taking the regular metformin.  He never picked up the Janumet.  He says he has not been exercising regularly. Lab Results  Component Value Date   HGBA1C 6.8 05/07/2017     Hypertension- Pt denies chest pain, SOB, dizziness, or heart palpitations.  Taking meds as directed w/o problems.  Denies medication side effects.    CAD- No chest pain, shortness of breath. He reports he is taking a statin and aspirin regularly. He is asking for refill on his statin.  Lab Results  Component Value Date   CHOL 147 05/07/2017   HDL 31 (L) 05/07/2017   LDLCALC 93 05/07/2017   LDLDIRECT 125 (H) 06/22/2014   TRIG 116 05/07/2017   CHOLHDL 4.7 05/07/2017     Past medical history, Surgical history, Family history not pertinant except as noted below, Social history, Allergies, and medications have been entered into the medical record, reviewed, and corrections made.   Review of Systems: No fevers, chills, night sweats, weight loss, chest pain, or shortness of breath.   Objective:    General: Well Developed, well nourished, and in no acute distress.  Neuro: Alert and oriented x3, extra-ocular muscles intact, sensation grossly intact.  HEENT: Normocephalic, atraumatic  Skin: Warm and dry, no rashes. Cardiac: Regular rate and rhythm, no murmurs rubs or gallops, no lower extremity edema.  Respiratory: Clear to auscultation bilaterally. Not using accessory muscles, speaking in full sentences.   Impression and Recommendations:    DM  - Uncontrolled. Hemoglobin A1c up to a 1.1. Previously 6.8 that he was unable to tolerate higher dose metformin and so has been taking a smaller dose and did not pick up the Janumet yet. That he plans on picking it up this week. I  gave him another coupon card today just to make sure that he'll be able to get the reduced co-pay price with the card. He also admits he has not been exercising and plans to get back on track with that.  HTN - Well controlled. Continue current regimen. Follow up in  3-4 months.   CAD - stable. Follows with cardiology once a year. We'll see them next summer in July. Did encourage him to get back on track with exercise.

## 2017-09-08 ENCOUNTER — Other Ambulatory Visit: Payer: Self-pay | Admitting: Family Medicine

## 2017-11-15 ENCOUNTER — Other Ambulatory Visit: Payer: Self-pay

## 2017-11-15 MED ORDER — OLMESARTAN MEDOXOMIL-HCTZ 40-25 MG PO TABS: 1.0000 | ORAL_TABLET | Freq: Every day | ORAL | 1 refills | Status: DC

## 2017-11-19 ENCOUNTER — Ambulatory Visit: Payer: PRIVATE HEALTH INSURANCE | Admitting: Family Medicine

## 2017-12-10 ENCOUNTER — Other Ambulatory Visit: Payer: Self-pay

## 2017-12-10 MED ORDER — AMLODIPINE BESYLATE 10 MG PO TABS: 10.0000 mg | ORAL_TABLET | Freq: Every day | ORAL | 1 refills | Status: DC

## 2017-12-11 ENCOUNTER — Other Ambulatory Visit: Payer: Self-pay

## 2017-12-11 MED ORDER — AMLODIPINE BESYLATE 10 MG PO TABS: 10.0000 mg | ORAL_TABLET | Freq: Every day | ORAL | 1 refills | Status: DC

## 2017-12-11 MED ORDER — OLMESARTAN MEDOXOMIL-HCTZ 40-25 MG PO TABS: 1.0000 | ORAL_TABLET | Freq: Every day | ORAL | 1 refills | Status: DC

## 2017-12-17 ENCOUNTER — Encounter: Payer: Self-pay | Admitting: Family Medicine

## 2017-12-17 ENCOUNTER — Ambulatory Visit (INDEPENDENT_AMBULATORY_CARE_PROVIDER_SITE_OTHER): Payer: BLUE CROSS/BLUE SHIELD | Admitting: Family Medicine

## 2017-12-17 VITALS — BP 128/78 | HR 87 | Ht 69.0 in | Wt 210.0 lb

## 2017-12-17 DIAGNOSIS — E119 Type 2 diabetes mellitus without complications: Secondary | ICD-10-CM | POA: Diagnosis not present

## 2017-12-17 DIAGNOSIS — I2510 Atherosclerotic heart disease of native coronary artery without angina pectoris: Secondary | ICD-10-CM | POA: Diagnosis not present

## 2017-12-17 DIAGNOSIS — I10 Essential (primary) hypertension: Secondary | ICD-10-CM | POA: Diagnosis not present

## 2017-12-17 DIAGNOSIS — R351 Nocturia: Secondary | ICD-10-CM | POA: Diagnosis not present

## 2017-12-17 LAB — POCT GLYCOSYLATED HEMOGLOBIN (HGB A1C): Hemoglobin A1C: 10.6

## 2017-12-17 MED ORDER — TAMSULOSIN HCL 0.4 MG PO CAPS: 0.4000 mg | ORAL_CAPSULE | Freq: Every day | ORAL | 3 refills | Status: DC

## 2017-12-17 MED ORDER — DULAGLUTIDE 0.75 MG/0.5ML ~~LOC~~ SOAJ: 0.7500 mg | SUBCUTANEOUS | 3 refills | Status: DC

## 2017-12-17 NOTE — Progress Notes (Signed)
Subjective:    CC: HTN, DM  HPI:  Hypertension- Pt denies chest pain, SOB, dizziness, or heart palpitations.  Taking meds as directed w/o problems.  Denies medication side effects.  Says he took his blood pressure medication right before he got here today.  Diabetes - no hypoglycemic events. No wounds or sores that are not healing well. No increased thirst or urination. Checking glucose at home. Taking medications as prescribed without any side effects.  He says he was out of his medication for several weeks but just recently refilled it.  He said when he was off the medication his blood sugar was running in the upper 200s-300 range and since being back on it he is been seeing more mid 100 range for his blood glucose.  F/U CAD -no recent chest pain or shortness of breath.  He is requesting referral to urology for his prostate.  He says is been getting up at least 3 times a night consistently to urinate.  He really denies any significant difficulty with hesitancy or weak urinary stream.  He does report some frequency during the daytime as well as some frequency at night.   Past medical history, Surgical history, Family history not pertinant except as noted below, Social history, Allergies, and medications have been entered into the medical record, reviewed, and corrections made.   Review of Systems: No fevers, chills, night sweats, weight loss, chest pain, or shortness of breath.   Objective:    General: Well Developed, well nourished, and in no acute distress.  Neuro: Alert and oriented x3, extra-ocular muscles intact, sensation grossly intact.  HEENT: Normocephalic, atraumatic  Skin: Warm and dry, no rashes. Cardiac: Regular rate and rhythm, no murmurs rubs or gallops, no lower extremity edema.  Respiratory: Clear to auscultation bilaterally. Not using accessory muscles, speaking in full sentences.   Impression and Recommendations:    HTN - Well controlled. Continue current regimen.  Follow up in  3 months.    DM -uncontrolled.  Heme globin A1c at 10.6.  We discussed adding Trulicity.  He was also had of his medication for several weeks of getting back on that consistently will help as well.  Also encouraged him to work on his diet.  Follow-up in 3 months.  CAD -table.  No recent issues.  Nocturia-AUA score of 4 which is mild the rates quality of life as unhappy 5.  Consider BPH versus overactive bladder.  We will try Flomax for 1 month to see if it improves his symptoms.  We will also refer to urology per his preference.  If not helping then he may be a better candidate for treatment for overactive bladder.

## 2018-01-05 ENCOUNTER — Encounter: Payer: Self-pay | Admitting: Emergency Medicine

## 2018-01-05 ENCOUNTER — Emergency Department (INDEPENDENT_AMBULATORY_CARE_PROVIDER_SITE_OTHER)
Admission: EM | Admit: 2018-01-05 | Discharge: 2018-01-05 | Disposition: A | Discharge status: Home / Self Care | Payer: BLUE CROSS/BLUE SHIELD | Source: Home / Self Care

## 2018-01-05 DIAGNOSIS — E1165 Type 2 diabetes mellitus with hyperglycemia: Secondary | ICD-10-CM

## 2018-01-05 LAB — POCT FASTING CBG KUC MANUAL ENTRY: POCT GLUCOSE (MANUAL ENTRY) KUC: 462 mg/dL — AB (ref 70–99)

## 2018-01-05 MED ORDER — INSULIN DETEMIR 100 UNIT/ML FLEXPEN: 10.0000 [IU] | PEN_INJECTOR | Freq: Every day | SUBCUTANEOUS | 1 refills | Status: DC

## 2018-01-05 NOTE — ED Triage Notes (Signed)
Patient states that he attempted to take his Trulicity injection this morning when the shot malfunctioned and not able to administer.  Patient blood sugars have been running high.

## 2018-01-05 NOTE — Discharge Instructions (Signed)
If symptoms become significantly worse during the night or over the weekend, proceed to the local emergency room.  

## 2018-01-05 NOTE — ED Provider Notes (Signed)
[facility]    CSN: [id number] Arrival date & time: 01/05/18  1356     History   Chief Complaint Chief Complaint  Patient presents with  . Blood Sugar Problem    HPI Momodou C. Dan HumphreysWalker is a 62 y.o. male.   Patient was recently started on Trulicity, with his last dose one week ago.  He states that during the past week his morning glucose has been ranging from 250+ to 300.  No hypoglycemic episodes.  He states that when he tried to use his Trulicity injector today, the device malfunctioned and he has not been able to administer his normal dose.  He reports that he must contact the manufacturer to obtain another unit.  He states that he feels well at present. Review of chart reveals that his last Hgb A1C was 10.6 on 12/17/17.  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   The history is provided by the patient.    Past Medical History:  Diagnosis Date  . Diabetes mellitus without complication (HCC)   . Hx of cardiovascular stress test    a. ETT-Myoview 3/14:  EF 56%, + inferior ischemia  . Hyperlipidemia   . Hypertension     Patient Active Problem List   Diagnosis Date Noted  . Abnormal stress test 05/26/2015  . Onychomycosis 09/01/2013  . Gout 09/01/2013  . CAD (coronary artery disease) 02/03/2013  . Diabetes mellitus without complication (HCC) 02/22/2010  . HYPERTENSION, BENIGN 02/02/2010  . HEMATURIA UNSPECIFIED 02/02/2010  . DERMATITIS, SEBORRHEIC 02/02/2010  . NOCTURIA 02/02/2010  . Hyperlipidemia 12/28/2008  . HYPERTROPHY PROSTATE W/O UR OBST & OTH LUTS 12/21/2008    Past Surgical History:  Procedure Laterality Date  . CORONARY ANGIOPLASTY WITH STENT PLACEMENT  04/2013   Dr. Chales Abrahamsyson at [facility]  . LEFT HEART CATHETERIZATION WITH CORONARY ANGIOGRAM N/A 01/31/2013   Procedure: LEFT HEART CATHETERIZATION WITH CORONARY ANGIOGRAM;  Surgeon: Peter M SwazilandJordan, MD;  [facility];  Service: Cardiovascular;  Laterality: N/A;        Home Medications    Prior to Admission medications   Medication Sig Start Date End Date Taking? Authorizing Provider  allopurinol (ZYLOPRIM) 300 MG tablet TAKE ONE & ONE-HALF TABLETS BY MOUTH ONCE DAILY 05/07/17   Agapito GamesMetheney, Catherine D, MD  amLODipine (NORVASC) 10 MG tablet Take 1 tablet (10 mg total) by mouth daily. 12/11/17   Agapito GamesMetheney, Catherine D, MD  aspirin 81 MG tablet Take 81 mg by mouth daily.    [provider]  atorvastatin (LIPITOR) 80 MG tablet Take 1 tablet (80 mg total) by mouth at bedtime. 08/13/17   Agapito GamesMetheney, Catherine D, MD  clopidogrel (PLAVIX) 75 MG tablet  04/06/14   [provider]  Dulaglutide (TRULICITY) 0.75 MG/0.5ML SOPN Inject 0.75 mg into the skin every 7 (seven) days. 12/17/17   Agapito GamesMetheney, Catherine D, MD  fish oil-omega-3 fatty acids 1000 MG capsule Take 1 g by mouth daily.    [provider]  Insulin Detemir (LEVEMIR FLEXTOUCH) 100 UNIT/ML Pen Inject 10 Units into the skin daily at 10 pm. 01/05/18   Lattie HawBeese, Solstice Lastinger A, MD  metoprolol tartrate (LOPRESSOR) 25 MG tablet Take 1 tablet (25 mg total) by mouth 2 (two) times daily. 08/13/17   Agapito GamesMetheney, Catherine D, MD  nitroGLYCERIN (NITROSTAT) 0.4 MG SL tablet Place 1 tablet (0.4 mg total) under the tongue every 5 (five) minutes as needed for chest pain. 09/09/15   Agapito GamesMetheney, Catherine D,  MD  NON FORMULARY Glucometer, strips, and lancets to check dailly     [provider]  olmesartan-hydrochlorothiazide (BENICAR HCT) 40-25 MG tablet Take 1 tablet by mouth daily. 12/11/17   Agapito Games, MD  SitaGLIPtin-MetFORMIN HCl 50-500 MG TB24 Take 1 tablet by mouth daily. Pt to bring coupon card. Patient not taking: Reported on 08/13/2017 05/07/17   Agapito Games, MD  tadalafil (CIALIS) 20 MG tablet Take 20 mg by mouth daily as needed for erectile dysfunction.     [provider]  tamsulosin (FLOMAX) 0.4 MG CAPS capsule Take 1 capsule (0.4 mg total) by mouth daily after supper.  12/17/17   Agapito Games, MD    Family History Family History  Problem Relation Age of Onset  . Heart failure Mother 67  . Hypertension Sister   . Hypertension Brother   . Hypertension Sister     Social History Social History   Tobacco Use  . Smoking status: Never Smoker  . Smokeless tobacco: Never Used  Substance Use Topics  . Alcohol use: Yes    Comment: 12 per month  . Drug use: No     Allergies   Patient has no known allergies.   Review of Systems Review of Systems  Constitutional: Negative for activity change, appetite change, chills, diaphoresis, fatigue and fever.  HENT: Negative.   Eyes: Negative.   Respiratory: Negative.   Cardiovascular: Negative.   Gastrointestinal: Negative.   Genitourinary: Negative.   Musculoskeletal: Negative.   Skin: Negative.   Neurological: Negative.      Physical Exam Triage Vital Signs ED Triage Vitals [01/05/18 1427]  Enc Vitals Group     BP 97/61     Pulse Rate 82     Resp      Temp 98.4 F (36.9 C)     Temp Source Oral     SpO2 97 %     Weight 198 lb 12 oz (90.2 kg)     Height 5\' 8"  (1.727 m)     Head Circumference      Peak Flow      Pain Score 0     Pain Loc      Pain Edu?      Excl. in GC?    No data found.  Updated Vital Signs BP 97/61 (BP Location: Right Arm)   Pulse 82   Temp 98.4 F (36.9 C) (Oral)   Ht 5\' 8"  (1.727 m)   Wt 198 lb 12 oz (90.2 kg)   SpO2 97%   BMI 30.22 kg/m   Visual Acuity Right Eye Distance:   Left Eye Distance:   Bilateral Distance:    Right Eye Near:   Left Eye Near:    Bilateral Near:     Physical Exam  Constitutional: He appears well-developed and well-nourished. No distress.  HENT:  Head: Normocephalic.  Mouth/Throat: Oropharynx is clear and moist.  Eyes: Pupils are equal, round, and reactive to light.  Cardiovascular: Normal rate.  Pulmonary/Chest: Effort normal.  Neurological: He is alert.  Skin: Skin is warm and dry.  Nursing note and vitals  reviewed.    UC Treatments / Results  Labs (all labs ordered are listed, but only abnormal results are displayed) Labs Reviewed  POCT FASTING CBG [facility] MANUAL ENTRY - Abnormal; Notable for the following components:      Result Value   POCT Glucose ([facility]) 462 (*)    All other components within normal limits    EKG  EKG Interpretation None       Radiology No results found.  Procedures Procedures (including critical care time)  Medications Ordered in UC Medications - No data to display   Initial Impression / Assessment and Plan / UC Course  I have reviewed the triage vital signs and the nursing notes.  Pertinent labs & imaging results that were available during my care of the patient were reviewed by me and considered in my medical decision making (see chart for details).    Patient will not be able to get a replacement Trulicity pen without contacting manufacturer.  Begin Insulin Detemir (Levemir Flextouch) 100 units/mL at a starting dose of 10 units [place] once daily at bedtime. Monitor blood glucose daily and record on a calendar.  Followup with Family Doctor in 48 hours. If symptoms become significantly worse during the night or over the weekend, proceed to the local emergency room.      Final Clinical Impressions(s) / UC Diagnoses   Final diagnoses:  Uncontrolled type 2 diabetes mellitus with hyperglycemia [facility])    ED Discharge Orders        Ordered    Insulin Detemir (LEVEMIR FLEXTOUCH) 100 UNIT/ML Pen  Daily at 10 pm     01/05/18 1504          Lattie Haw, MD 01/06/18 [phone]

## 2018-01-06 DIAGNOSIS — R079 Chest pain, unspecified: Secondary | ICD-10-CM | POA: Diagnosis not present

## 2018-01-06 DIAGNOSIS — R42 Dizziness and giddiness: Secondary | ICD-10-CM | POA: Diagnosis not present

## 2018-01-06 DIAGNOSIS — R002 Palpitations: Secondary | ICD-10-CM | POA: Diagnosis not present

## 2018-01-06 DIAGNOSIS — R000 Tachycardia, unspecified: Secondary | ICD-10-CM | POA: Diagnosis not present

## 2018-01-06 DIAGNOSIS — I10 Essential (primary) hypertension: Secondary | ICD-10-CM | POA: Diagnosis not present

## 2018-01-06 DIAGNOSIS — E1165 Type 2 diabetes mellitus with hyperglycemia: Secondary | ICD-10-CM | POA: Diagnosis not present

## 2018-01-06 NOTE — ED Notes (Signed)
Non- fasting BG should be 426; final correction.

## 2018-01-06 NOTE — ED Notes (Signed)
Patient's non-fasting BG was 462; other entry of 426 was error. PK

## 2018-01-07 ENCOUNTER — Ambulatory Visit (INDEPENDENT_AMBULATORY_CARE_PROVIDER_SITE_OTHER): Payer: BLUE CROSS/BLUE SHIELD | Admitting: Family Medicine

## 2018-01-07 ENCOUNTER — Encounter: Payer: Self-pay | Admitting: Family Medicine

## 2018-01-07 VITALS — BP 124/75 | HR 68 | Ht 69.0 in | Wt 203.0 lb

## 2018-01-07 DIAGNOSIS — I10 Essential (primary) hypertension: Secondary | ICD-10-CM

## 2018-01-07 DIAGNOSIS — E119 Type 2 diabetes mellitus without complications: Secondary | ICD-10-CM

## 2018-01-07 DIAGNOSIS — R002 Palpitations: Secondary | ICD-10-CM

## 2018-01-07 LAB — COMPLETE METABOLIC PANEL WITH GFR
AG RATIO: 1.7 (calc) (ref 1.0–2.5)
ALT: 18 U/L (ref 9–46)
AST: 14 U/L (ref 10–35)
Albumin: 4.7 g/dL (ref 3.6–5.1)
Alkaline phosphatase (APISO): 93 U/L (ref 40–115)
BUN: 20 mg/dL (ref 7–25)
CALCIUM: 10.4 mg/dL — AB (ref 8.6–10.3)
CO2: 24 mmol/L (ref 20–32)
CREATININE: 1.03 mg/dL (ref 0.70–1.33)
Chloride: 101 mmol/L (ref 98–110)
GFR, EST AFRICAN AMERICAN: 92 mL/min/{1.73_m2} (ref >=60)
GFR, EST NON AFRICAN AMERICAN: 79 mL/min/{1.73_m2} (ref >=60)
GLOBULIN: 2.8 g/dL (ref 1.9–3.7)
Glucose, Bld: 392 mg/dL — ABNORMAL HIGH (ref 65–99)
POTASSIUM: 4.5 mmol/L (ref 3.5–5.3)
SODIUM: 134 mmol/L — AB (ref 135–146)
Total Bilirubin: 0.5 mg/dL (ref 0.2–1.2)
Total Protein: 7.5 g/dL (ref 6.1–8.1)

## 2018-01-07 LAB — LIPID PANEL
CHOL/HDL RATIO: 5 (calc) — AB (ref <5.0)
Cholesterol: 150 mg/dL (ref <200)
HDL: 30 mg/dL — ABNORMAL LOW (ref >40)
LDL Cholesterol (Calc): 95 mg/dL (calc)
NON-HDL CHOLESTEROL (CALC): 120 mg/dL (ref <130)
Triglycerides: 153 mg/dL — ABNORMAL HIGH (ref <150)

## 2018-01-07 NOTE — Patient Instructions (Signed)
Increase Levemir to 25 units tonight.  Increase by 2 units every night until your sugar in the morning is under 130.  When she reached that number then stay at that dose on your Levemir until I see you back. If you could give me a call in about a week and let me know how you are doing and what dose you are taking and what your blood sugars are running that would be fantastic. We will call you with your blood work results as soon as they are available. If you see a blood sugar over 380 please give us a call. Really work hard on increasing your water intake daily.

## 2018-01-07 NOTE — Progress Notes (Signed)
Subjective:    CC: ED F/U for hyperglycemia   HPI:  He went to Urgent Care on 3/2 for hyperglycemia. His trulicity was malfunctioning.  He went to the emergency department yesterday at [facility] for palpitations.  His heart stopped racing around the time the EMS came to pick him up.  Workup was negative for acute coronary syndrome and chest x-ray was negative as well.  EKG did not show any type of arrhythmia.  Glucose was 340 at that time so they gave him IV insulin there.  Following up today. He hasn't bee on his Janumet.  He says his blood sugar this morning was 301.  Last night he had fried chicken, pinto beans, coleslaw and a biscuit.  He is no longer having any palpitations and has not expands any chest pain.  In that respect he is feeling much better.  Past medical history, Surgical history, Family history not pertinant except as noted below, Social history, Allergies, and medications have been entered into the medical record, reviewed, and corrections made.   Review of Systems: No fevers, chills, night sweats, weight loss, chest pain, or shortness of breath.   Objective:    General: Well Developed, well nourished, and in no acute distress.  Neuro: Alert and oriented x3, extra-ocular muscles intact, sensation grossly intact.  HEENT: Normocephalic, atraumatic  Skin: Warm and dry, no rashes. Cardiac: Regular rate and rhythm, no murmurs rubs or gallops, no lower extremity edema.  Respiratory: Clear to auscultation bilaterally. Not using accessory muscles, speaking in full sentences.   Impression and Recommendations:    HTN - Well controlled. Continue current regimen. Follow up in  6 months.    DM -uncontrolled.  He eats a very carb heavy diet.  Discussed dietary changed.  Discussed that some amount of carbs is okay but making choices and trying to increase vegetables and decrease the overall amount of carbs will make a big difference in his blood sugars.  Work on stopping sweet tea  and gatorade.    Palpitations - resolved.

## 2018-01-15 ENCOUNTER — Other Ambulatory Visit: Payer: Self-pay

## 2018-01-15 MED ORDER — INSULIN DETEMIR 100 UNIT/ML FLEXPEN: 42.0000 [IU] | PEN_INJECTOR | Freq: Every day | SUBCUTANEOUS | 3 refills | Status: DC

## 2018-01-15 NOTE — Telephone Encounter (Signed)
Steven Estrada states he is now up to 42 units nightly. His fasting blood sugar is 120 mg/dl. He needs an updated prescription.

## 2018-01-15 NOTE — Addendum Note (Signed)
Addended by: Chalmers CaterUTTLE, Kaylub Detienne H on: 01/15/2018 11:59 AM   Modules accepted: Orders

## 2018-03-11 DIAGNOSIS — R351 Nocturia: Secondary | ICD-10-CM | POA: Diagnosis not present

## 2018-03-11 DIAGNOSIS — N5201 Erectile dysfunction due to arterial insufficiency: Secondary | ICD-10-CM | POA: Diagnosis not present

## 2018-03-18 ENCOUNTER — Ambulatory Visit: Payer: BLUE CROSS/BLUE SHIELD | Admitting: Family Medicine

## 2018-03-29 DIAGNOSIS — N5201 Erectile dysfunction due to arterial insufficiency: Secondary | ICD-10-CM | POA: Diagnosis not present

## 2018-03-29 DIAGNOSIS — R351 Nocturia: Secondary | ICD-10-CM | POA: Diagnosis not present

## 2018-04-15 ENCOUNTER — Ambulatory Visit: Payer: BLUE CROSS/BLUE SHIELD | Admitting: Family Medicine

## 2018-04-22 ENCOUNTER — Telehealth: Payer: Self-pay | Admitting: Family Medicine

## 2018-04-22 ENCOUNTER — Ambulatory Visit: Payer: BLUE CROSS/BLUE SHIELD | Admitting: Family Medicine

## 2018-04-22 NOTE — Telephone Encounter (Signed)
Pt called at 9:03 to cancel his appointment for this  Morning.

## 2018-05-01 ENCOUNTER — Other Ambulatory Visit: Payer: Self-pay | Admitting: *Deleted

## 2018-05-01 DIAGNOSIS — E119 Type 2 diabetes mellitus without complications: Secondary | ICD-10-CM

## 2018-05-01 MED ORDER — SITAGLIP PHOS-METFORMIN HCL ER 50-500 MG PO TB24: 1.0000 | ORAL_TABLET | Freq: Every day | ORAL | 0 refills | Status: DC

## 2018-05-06 ENCOUNTER — Encounter: Payer: Self-pay | Admitting: Family Medicine

## 2018-05-06 ENCOUNTER — Ambulatory Visit: Payer: BLUE CROSS/BLUE SHIELD | Admitting: Family Medicine

## 2018-05-06 VITALS — BP 122/74 | HR 83 | Ht 68.0 in | Wt 206.0 lb

## 2018-05-06 DIAGNOSIS — L83 Acanthosis nigricans: Secondary | ICD-10-CM

## 2018-05-06 DIAGNOSIS — E119 Type 2 diabetes mellitus without complications: Secondary | ICD-10-CM

## 2018-05-06 DIAGNOSIS — I2510 Atherosclerotic heart disease of native coronary artery without angina pectoris: Secondary | ICD-10-CM | POA: Diagnosis not present

## 2018-05-06 DIAGNOSIS — B360 Pityriasis versicolor: Secondary | ICD-10-CM | POA: Diagnosis not present

## 2018-05-06 LAB — POCT GLYCOSYLATED HEMOGLOBIN (HGB A1C): HEMOGLOBIN A1C: 7.4 % — AB (ref 4.0–5.6)

## 2018-05-06 MED ORDER — KETOCONAZOLE 2 % EX CREA: 1.0000 "application " | TOPICAL_CREAM | Freq: Two times a day (BID) | CUTANEOUS | 1 refills | Status: DC | PRN

## 2018-05-06 MED ORDER — DULAGLUTIDE 0.75 MG/0.5ML ~~LOC~~ SOAJ: 0.7500 mg | SUBCUTANEOUS | 3 refills | Status: DC

## 2018-05-06 MED ORDER — SITAGLIP PHOS-METFORMIN HCL ER 50-500 MG PO TB24: 1.0000 | ORAL_TABLET | Freq: Every day | ORAL | 1 refills | Status: DC

## 2018-05-06 NOTE — Progress Notes (Signed)
Subjective:    CC: DM  HPI:  Diabetes - no hypoglycemic events. No wounds or sores that are not healing well. No increased thirst or urination. Checking glucose at home. Taking medications as prescribed without any side effects. I had increased his Levemir at last OV for uncontrolled. A1C. He was drinking sweet tea and gatorade.  He is up to 56 units of his Levemir but has also been out of his Janumet for about a month.  And also has been off his Trulicity. Lab Results  Component Value Date   HGBA1C 10.6 12/17/2017    F/U CAD -no recent chest pain or shortness of breath.  Doing well overall.  He has noticed a splotchy rash around his neck and on his scalp that he would like me to look at today.  It does not really bothersome but he would like to have it addressed.  Past medical history, Surgical history, Family history not pertinant except as noted below, Social history, Allergies, and medications have been entered into the medical record, reviewed, and corrections made.   Review of Systems: No fevers, chills, night sweats, weight loss, chest pain, or shortness of breath.   Objective:    General: Well Developed, well nourished, and in no acute distress.  Neuro: Alert and oriented x3, extra-ocular muscles intact, sensation grossly intact.  HEENT: Normocephalic, atraumatic  Skin: Warm and dry.  He does have some hyperpigmentation that an oval and round shaped lesions around the neck as well as a couple on his scalp.  He has some on his upper back as well.  He also has some thickening and hyperpigmentation along the crease in the posteriorly along the neck Cardiac: Regular rate and rhythm, no murmurs rubs or gallops, no lower extremity edema.  Respiratory: Clear to auscultation bilaterally. Not using accessory muscles, speaking in full sentences.   Impression and Recommendations:    DM -much much improved.  A1c is down 3 points from 10.6 down to 7.4 today which is fantastic.  Continue  with 56 units of Levemir and restart Janumet.  In fact he may notice he can actually come down on his insulin.  I want most of his blood sugars running under 130.  He plans also restart the Trulicity in which case that would also probably bring down his insulin needs as well.  He is really made some big strides and making some dietary changes and cutting out sweet tea and fried foods he says it has been hard but he has been trying to keep it up.  Follow back up in 3 months.  CAD -stable.  Asymptomatic.  Currently on a statin and trying to get maximal control of his diabetes.  Tinea versicolor-rash is most consistent with tinea versicolor.  We will treat with ketoconazole cream.  If not improving then please let me know.    He also has some hyperpigmentation around his neck consistent with acanthosis nigra cans related to his diabetes.  Discussed that that should improve as well as we get his diabetes under better control.

## 2018-06-10 ENCOUNTER — Telehealth: Payer: Self-pay

## 2018-06-10 MED ORDER — INSULIN DETEMIR 100 UNIT/ML FLEXPEN: 56.0000 [IU] | PEN_INJECTOR | Freq: Every day | SUBCUTANEOUS | 3 refills | Status: DC

## 2018-06-10 NOTE — Telephone Encounter (Signed)
Pt called [facility]  Last office note states: "DM -much much improved.  A1c is down 3 points from 10.6 down to 7.4 today which is fantastic.  Continue with 56 units of Levemir and restart Janumet.  In fact he may notice he can actually come down on his insulin.  I want most of his blood sugars running under 130. "

## 2018-08-12 ENCOUNTER — Other Ambulatory Visit: Payer: Self-pay | Admitting: Family Medicine

## 2018-08-12 ENCOUNTER — Ambulatory Visit: Payer: BLUE CROSS/BLUE SHIELD | Admitting: Family Medicine

## 2018-08-12 DIAGNOSIS — E119 Type 2 diabetes mellitus without complications: Secondary | ICD-10-CM

## 2018-08-13 ENCOUNTER — Ambulatory Visit: Payer: BLUE CROSS/BLUE SHIELD | Admitting: Family Medicine

## 2018-08-13 ENCOUNTER — Encounter: Payer: Self-pay | Admitting: Family Medicine

## 2018-08-13 VITALS — BP 121/71 | HR 108 | Ht 68.0 in | Wt 212.0 lb

## 2018-08-13 DIAGNOSIS — I10 Essential (primary) hypertension: Secondary | ICD-10-CM

## 2018-08-13 DIAGNOSIS — Z125 Encounter for screening for malignant neoplasm of prostate: Secondary | ICD-10-CM | POA: Diagnosis not present

## 2018-08-13 DIAGNOSIS — E871 Hypo-osmolality and hyponatremia: Secondary | ICD-10-CM

## 2018-08-13 DIAGNOSIS — E119 Type 2 diabetes mellitus without complications: Secondary | ICD-10-CM | POA: Diagnosis not present

## 2018-08-13 DIAGNOSIS — Z23 Encounter for immunization: Secondary | ICD-10-CM | POA: Diagnosis not present

## 2018-08-13 LAB — POCT GLYCOSYLATED HEMOGLOBIN (HGB A1C): HEMOGLOBIN A1C: 6.4 % — AB (ref 4.0–5.6)

## 2018-08-13 MED ORDER — ATORVASTATIN CALCIUM 80 MG PO TABS: 80.0000 mg | ORAL_TABLET | Freq: Every day | ORAL | 3 refills | Status: DC

## 2018-08-13 MED ORDER — METOPROLOL TARTRATE 25 MG PO TABS: 25.0000 mg | ORAL_TABLET | Freq: Two times a day (BID) | ORAL | 3 refills | Status: DC

## 2018-08-13 MED ORDER — DULAGLUTIDE 0.75 MG/0.5ML ~~LOC~~ SOAJ: 0.7500 mg | SUBCUTANEOUS | 0 refills | Status: DC

## 2018-08-13 MED ORDER — OLMESARTAN MEDOXOMIL-HCTZ 40-25 MG PO TABS: 1.0000 | ORAL_TABLET | Freq: Every day | ORAL | 3 refills | Status: DC

## 2018-08-13 MED ORDER — INSULIN DETEMIR 100 UNIT/ML FLEXPEN: 60.0000 [IU] | PEN_INJECTOR | Freq: Every day | SUBCUTANEOUS | 5 refills | Status: DC

## 2018-08-13 MED ORDER — AMLODIPINE BESYLATE 10 MG PO TABS: 10.0000 mg | ORAL_TABLET | Freq: Every day | ORAL | 3 refills | Status: DC

## 2018-08-13 MED ORDER — ALLOPURINOL 300 MG PO TABS: ORAL_TABLET | ORAL | 3 refills | Status: DC

## 2018-08-13 NOTE — Progress Notes (Signed)
Subjective:    CC: HTN, DM  HPI:  Diabetes - no hypoglycemic events. No wounds or sores that are not healing well. No increased thirst or urination. Checking glucose at home. Taking medications as prescribed without any side effects.  He is up to 60 units on his Levemir.  Also on Janumet XR 50/500 daily.  Hypertension- Pt denies chest pain, SOB, dizziness, or heart palpitations.  Taking meds as directed w/o problems.  Denies medication side effects.    Gout-overall he is doing really well.  He has not had any recent flares.  He does take his allopurinol regularly.  He has noticed that shellfish is a big trigger for him.  Past medical history, Surgical history, Family history not pertinant except as noted below, Social history, Allergies, and medications have been entered into the medical record, reviewed, and corrections made.   Review of Systems: No fevers, chills, night sweats, weight loss, chest pain, or shortness of breath.   Objective:    General: Well Developed, well nourished, and in no acute distress.  Neuro: Alert and oriented x3, extra-ocular muscles intact, sensation grossly intact.  HEENT: Normocephalic, atraumatic  Skin: Warm and dry, no rashes. Cardiac: Regular rate and rhythm, no murmurs rubs or gallops, no lower extremity edema.  Respiratory: Clear to auscultation bilaterally. Not using accessory muscles, speaking in full sentences.   Impression and Recommendations:    HTN - Well controlled. Continue current regimen. Follow up in  4 months.    DM - Well controlled.  Mentally we would like to get him off of insulin completely.  We will have him restart the Trulicity at 0.75 mg after 1 week we will have him decrease his Levemir down to 50 units.  What she is been on the 50 units daily for about a week I want him to call me back with his blood sugar level so that we can continue to adjust his dose with the ultimate goal of getting him off.  Hopefully after 1 month we can go  ahead and increase his Trulicity to 1.5 mg and continue to taper the insulin.  Follow up in 3 months. A1C was 6.4    Gout - stable. No recent flares. RF his allopurinol.    Also on last labs done in the spring the sodium was borderline low and calcium was just mildly elevated.  We need to recheck those levels today.

## 2018-08-14 LAB — COMPLETE METABOLIC PANEL WITH GFR
AG Ratio: 1.8 (calc) (ref 1.0–2.5)
ALBUMIN MSPROF: 4.4 g/dL (ref 3.6–5.1)
ALKALINE PHOSPHATASE (APISO): 75 U/L (ref 40–115)
ALT: 16 U/L (ref 9–46)
AST: 15 U/L (ref 10–35)
BILIRUBIN TOTAL: 0.4 mg/dL (ref 0.2–1.2)
BUN: 17 mg/dL (ref 7–25)
CO2: 31 mmol/L (ref 20–32)
Calcium: 9.6 mg/dL (ref 8.6–10.3)
Chloride: 100 mmol/L (ref 98–110)
Creat: 0.98 mg/dL (ref 0.70–1.25)
GFR, EST AFRICAN AMERICAN: 97 mL/min/{1.73_m2} (ref >=60)
GFR, EST NON AFRICAN AMERICAN: 83 mL/min/{1.73_m2} (ref >=60)
GLUCOSE: 188 mg/dL — AB (ref 65–99)
Globulin: 2.4 g/dL (calc) (ref 1.9–3.7)
POTASSIUM: 4.4 mmol/L (ref 3.5–5.3)
Sodium: 137 mmol/L (ref 135–146)
Total Protein: 6.8 g/dL (ref 6.1–8.1)

## 2018-08-14 LAB — PSA: PSA: 0.5 ng/mL (ref <=4.0)

## 2018-09-24 DIAGNOSIS — I10 Essential (primary) hypertension: Secondary | ICD-10-CM | POA: Diagnosis not present

## 2018-09-24 DIAGNOSIS — E7849 Other hyperlipidemia: Secondary | ICD-10-CM | POA: Diagnosis not present

## 2018-09-24 DIAGNOSIS — I2510 Atherosclerotic heart disease of native coronary artery without angina pectoris: Secondary | ICD-10-CM | POA: Insufficient documentation

## 2018-09-24 LAB — HM DIABETES EYE EXAM

## 2018-11-07 ENCOUNTER — Encounter: Payer: Self-pay | Admitting: Family Medicine

## 2018-11-11 ENCOUNTER — Ambulatory Visit: Payer: BLUE CROSS/BLUE SHIELD | Admitting: Family Medicine

## 2018-11-11 ENCOUNTER — Encounter: Payer: Self-pay | Admitting: Family Medicine

## 2018-11-11 ENCOUNTER — Ambulatory Visit (INDEPENDENT_AMBULATORY_CARE_PROVIDER_SITE_OTHER): Payer: BLUE CROSS/BLUE SHIELD | Admitting: Family Medicine

## 2018-11-11 VITALS — BP 119/75 | HR 73 | Ht 68.0 in | Wt 209.0 lb

## 2018-11-11 DIAGNOSIS — I10 Essential (primary) hypertension: Secondary | ICD-10-CM | POA: Diagnosis not present

## 2018-11-11 DIAGNOSIS — L918 Other hypertrophic disorders of the skin: Secondary | ICD-10-CM | POA: Diagnosis not present

## 2018-11-11 DIAGNOSIS — E119 Type 2 diabetes mellitus without complications: Secondary | ICD-10-CM | POA: Diagnosis not present

## 2018-11-11 LAB — POCT GLYCOSYLATED HEMOGLOBIN (HGB A1C): HEMOGLOBIN A1C: 6.5 % — AB (ref 4.0–5.6)

## 2018-11-11 NOTE — Patient Instructions (Signed)
Okay to take a shower tomorrow.  Do not scrub at the area for the next couple of days just let the soap and water hit it and pat dry.  Call if any problems or any concerns with wound infection.

## 2018-11-11 NOTE — Progress Notes (Signed)
Subjective:    CC: DM and wants skin tags removed  HPI:  Diabetes - no hypoglycemic events. No wounds or sores that are not healing well. No increased thirst or urination. Checking glucose at home. Taking medications as prescribed without any side effects.  Hypertension- Pt denies chest pain, SOB, dizziness, or heart palpitations.  Taking meds as directed w/o problems.  Denies medication side effects.    Would like some skin tags removed.  He has several on the back of hsi neck he would like removed.    Also noted that has noticed that his lower ribs seem to be protruding a little bit more than they used to.  No pain or injury or old fracture that he is aware of.  Past medical history, Surgical history, Family history not pertinant except as noted below, Social history, Allergies, and medications have been entered into the medical record, reviewed, and corrections made.   Review of Systems: No fevers, chills, night sweats, weight loss, chest pain, or shortness of breath.   Objective:    General: Well Developed, well nourished, and in no acute distress.  Neuro: Alert and oriented x3, extra-ocular muscles intact, sensation grossly intact.  HEENT: Normocephalic, atraumatic  Skin: Warm and dry, no rashes. Cardiac: Regular rate and rhythm, no murmurs rubs or gallops, no lower extremity edema.  Respiratory: Clear to auscultation bilaterally. Not using accessory muscles, speaking in full sentences. MSK: Do not palpate any abnormalities on the lower ribs.  No distinct mass.  They just protrude a little bit more than the upper ribs.  Impression and Recommendations:    HTN -controlled.  Continue current regimen.  Follow-up in 3 to 4 months.  DM -well controlled.  Foot exam performed today.  Of an A1c of 6.5.  Skin tag -discussed removal.  No anesthetic required as these are fairly small to remove.  Most of them are between 1 and 3 mm in size.  Skin Tag Removal Procedure  Note  Pre-operative Diagnosis: Classic skin tags (acrochordon)  Post-operative Diagnosis: Classic skin tags (acrochordon)  Locations:posterior neck  Indications: irritation on his collar  Anesthesia: not required    Procedure Details  The risks (including bleeding and infection) and benefits of the procedure and Verbal informed consent obtained. Using sterile iris scissors, multiple skin tags were snipped off at their bases after cleansing with Betadine.  Bleeding was controlled by pressure.   Findings: Pathognomonic benign lesions  not sent for pathological exam.  Condition: Stable  Complications: none.  Plan: 1. Instructed to keep the wounds dry and covered for 24-48h and clean thereafter. 2. Warning signs of infection were reviewed.   3. Recommended that the patient use OTC acetaminophen as needed for pain.  4. Return as needed.

## 2018-11-22 ENCOUNTER — Other Ambulatory Visit: Payer: Self-pay | Admitting: Family Medicine

## 2018-12-13 DIAGNOSIS — H1132 Conjunctival hemorrhage, left eye: Secondary | ICD-10-CM | POA: Diagnosis not present

## 2018-12-16 ENCOUNTER — Telehealth: Payer: Self-pay | Admitting: Family Medicine

## 2018-12-16 NOTE — Telephone Encounter (Signed)
Received a fax from [facility] that PA is not required for the Trulicity unless the patient needs more than 4 pens a month.

## 2018-12-16 NOTE — Telephone Encounter (Signed)
Received fax from [employer] that Trulicity requires a PA. Information has been sent to the insurance company. Awaiting determination.   

## 2018-12-23 ENCOUNTER — Telehealth: Payer: Self-pay | Admitting: Family Medicine

## 2018-12-23 DIAGNOSIS — E119 Type 2 diabetes mellitus without complications: Secondary | ICD-10-CM

## 2018-12-23 MED ORDER — DULAGLUTIDE 0.75 MG/0.5ML ~~LOC~~ SOAJ: 0.7500 mg | SUBCUTANEOUS | 1 refills | Status: DC

## 2018-12-23 MED ORDER — SITAGLIP PHOS-METFORMIN HCL ER 50-500 MG PO TB24: 1.0000 | ORAL_TABLET | Freq: Every day | ORAL | 1 refills | Status: DC

## 2018-12-23 NOTE — Telephone Encounter (Signed)
Pt left VM on triage line stating the pharmacy needed new Rx. Rx sent.

## 2018-12-24 ENCOUNTER — Other Ambulatory Visit: Payer: Self-pay | Admitting: Family Medicine

## 2018-12-24 NOTE — Telephone Encounter (Signed)
[facility] will [facility] this and PA is not requires since patient is only using 4 pens a month.

## 2018-12-25 NOTE — Telephone Encounter (Signed)
This request has received a Cancelled outcome. This may mean either your patient does not have active coverage with this plan, this authorization was processed as a duplicate request, or an authorization was not needed for this medication. Note any additional information provided by [facility] at the bottom of this request, and contact [employer] [place] directly for further.   I have left a brief VM with an update from insurance for the patient.

## 2018-12-25 NOTE — Telephone Encounter (Signed)
I have resent the information to the insurance again and waiting on a response.

## 2018-12-25 NOTE — Telephone Encounter (Signed)
Pt called stating he still cannot get his meds due to PA being required. Went over phone note, states they are still advising something has to be done for the Trulicity and Janumet.  Routing to [place] for completion. Pt would like callback with status update.

## 2018-12-27 NOTE — Telephone Encounter (Signed)
I called [employer] to check the status of the Trulicity and per Steven Estrada he was not able to find the PA. I told him that a form was faxed and received confirmation. He placed me on hold for 15 minutes and found the form. Our office should get a response by the middle of next week.   Patient has been updated and did not have any other questions.

## 2018-12-30 MED ORDER — DULAGLUTIDE 0.75 MG/0.5ML ~~LOC~~ SOAJ: 0.7500 mg | SUBCUTANEOUS | 0 refills | Status: DC

## 2018-12-30 NOTE — Telephone Encounter (Addendum)
Received a fax from [facility] that Trulicity has been approved through 12/28/2019. Pharmacy aware and form sent to scan. Patient is aware as well and did not have any further questions.

## 2018-12-30 NOTE — Addendum Note (Signed)
Addended by: Arvilla Market on: 12/30/2018 01:58 PM   Modules accepted: Orders

## 2018-12-31 NOTE — Telephone Encounter (Signed)
Janumet approved through 12/30/2019. Pharmacy aware and form sent to scan.

## 2019-01-24 ENCOUNTER — Other Ambulatory Visit: Payer: Self-pay | Admitting: Family Medicine

## 2019-02-24 ENCOUNTER — Other Ambulatory Visit: Payer: Self-pay | Admitting: Family Medicine

## 2019-03-10 ENCOUNTER — Ambulatory Visit (INDEPENDENT_AMBULATORY_CARE_PROVIDER_SITE_OTHER): Payer: BLUE CROSS/BLUE SHIELD | Admitting: Family Medicine

## 2019-03-10 ENCOUNTER — Encounter: Payer: Self-pay | Admitting: Family Medicine

## 2019-03-10 VITALS — Ht 68.0 in

## 2019-03-10 DIAGNOSIS — M1A09X0 Idiopathic chronic gout, multiple sites, without tophus (tophi): Secondary | ICD-10-CM

## 2019-03-10 DIAGNOSIS — E7800 Pure hypercholesterolemia, unspecified: Secondary | ICD-10-CM | POA: Diagnosis not present

## 2019-03-10 DIAGNOSIS — I10 Essential (primary) hypertension: Secondary | ICD-10-CM

## 2019-03-10 DIAGNOSIS — E119 Type 2 diabetes mellitus without complications: Secondary | ICD-10-CM | POA: Diagnosis not present

## 2019-03-10 DIAGNOSIS — I2510 Atherosclerotic heart disease of native coronary artery without angina pectoris: Secondary | ICD-10-CM

## 2019-03-10 MED ORDER — DULAGLUTIDE 0.75 MG/0.5ML ~~LOC~~ SOAJ: SUBCUTANEOUS | 3 refills | Status: DC

## 2019-03-10 MED ORDER — ROSUVASTATIN CALCIUM 40 MG PO TABS: 40.0000 mg | ORAL_TABLET | Freq: Every day | ORAL | 3 refills | Status: DC

## 2019-03-10 NOTE — Progress Notes (Signed)
Virtual Visit via Video Note  I connected with Steven Estrada on 03/10/19 at  9:30 AM EDT by a video enabled telemedicine application and verified that I am speaking with the correct person using two identifiers.   I discussed the limitations of evaluation and management by telemedicine and the availability of in person appointments. The patient expressed understanding and agreed to proceed.  Pt was at home and I was in my office for the virtual visit.     Subjective:    CC: 4 mo f/u for DM  HPI:  Hypertension- Pt denies chest pain, SOB, dizziness, or heart palpitations.  Taking meds as directed w/o problems.  Denies medication side effects.  Currently on amlodipine and metoprolol and Benicar HCT  Diabetes - no hypoglycemic events. No wounds or sores that are not healing well. No increased thirst or urination. Checking glucose at home. Taking medications as prescribed without any side effects.  Currently on Trulicity and Janumet XR.  Both medications required prior authorization. 107-120. He has been eating better.   F/U hyperlipidemia -currently on Crestor and fish oil. Lab Results  Component Value Date   CHOL 150 01/07/2018   HDL 30 (L) 01/07/2018   LDLCALC 95 01/07/2018   LDLDIRECT 125 (H) 06/22/2014   TRIG 153 (H) 01/07/2018   CHOLHDL 5.0 (H) 01/07/2018     Gout - he is doing well. No recent flares.  Taking medication regularly.  Past medical history, Surgical history, Family history not pertinant except as noted below, Social history, Allergies, and medications have been entered into the medical record, reviewed, and corrections made.   Review of Systems: No fevers, chills, night sweats, weight loss, chest pain, or shortness of breath.   Objective:    General: Speaking clearly in complete sentences without any shortness of breath.  Alert and oriented x3.  Normal judgment. No apparent acute distress.    Impression and Recommendations:    DM -sounds like his home  blood sugars have been in a great range and hopefully his A1c looks fantastic.  HTN -he is asymptomatic but not able to check his blood pressure at home so we will need to monitor that and follow back up in about 3 to 4 months.  Hyperlipidemia -due to recheck lipids.  He is actually taking Crestor 20 but really wants to go to the max dose as he was previously on 80 of Lipitor with his cardiologist so we will increase his Crestor to 40 mg daily.  Gout -no recent flares.  Due to recheck uric acid level.      I discussed the assessment and treatment plan with the patient. The patient was provided an opportunity to ask questions and all were answered. The patient agreed with the plan and demonstrated an understanding of the instructions.   The patient was advised to call back or seek an in-person evaluation if the symptoms worsen or if the condition fails to improve as anticipated.   Nani Gasser, MD

## 2019-04-04 ENCOUNTER — Other Ambulatory Visit: Payer: Self-pay | Admitting: Family Medicine

## 2019-04-04 DIAGNOSIS — E119 Type 2 diabetes mellitus without complications: Secondary | ICD-10-CM

## 2019-08-27 ENCOUNTER — Other Ambulatory Visit: Payer: Self-pay | Admitting: Family Medicine

## 2019-10-01 ENCOUNTER — Other Ambulatory Visit: Payer: Self-pay | Admitting: Family Medicine

## 2019-10-06 ENCOUNTER — Encounter: Payer: Self-pay | Admitting: Family Medicine

## 2019-10-06 ENCOUNTER — Other Ambulatory Visit: Payer: Self-pay

## 2019-10-06 ENCOUNTER — Ambulatory Visit (INDEPENDENT_AMBULATORY_CARE_PROVIDER_SITE_OTHER): Payer: Self-pay | Admitting: Family Medicine

## 2019-10-06 VITALS — BP 121/78 | HR 87 | Ht 68.0 in | Wt 198.0 lb

## 2019-10-06 DIAGNOSIS — Z23 Encounter for immunization: Secondary | ICD-10-CM

## 2019-10-06 DIAGNOSIS — E7800 Pure hypercholesterolemia, unspecified: Secondary | ICD-10-CM

## 2019-10-06 DIAGNOSIS — E119 Type 2 diabetes mellitus without complications: Secondary | ICD-10-CM

## 2019-10-06 DIAGNOSIS — I10 Essential (primary) hypertension: Secondary | ICD-10-CM

## 2019-10-06 DIAGNOSIS — M1A09X0 Idiopathic chronic gout, multiple sites, without tophus (tophi): Secondary | ICD-10-CM

## 2019-10-06 LAB — POCT GLYCOSYLATED HEMOGLOBIN (HGB A1C): Hemoglobin A1C: 5.9 % — AB (ref 4.0–5.6)

## 2019-10-06 MED ORDER — AMLODIPINE BESYLATE 10 MG PO TABS: 10.0000 mg | ORAL_TABLET | Freq: Every day | ORAL | 1 refills | Status: DC

## 2019-10-06 MED ORDER — METFORMIN HCL ER 500 MG PO TB24: 500.0000 mg | ORAL_TABLET | Freq: Every day | ORAL | 1 refills | Status: DC

## 2019-10-06 MED ORDER — OLMESARTAN MEDOXOMIL-HCTZ 40-25 MG PO TABS: 1.0000 | ORAL_TABLET | Freq: Every day | ORAL | 1 refills | Status: DC

## 2019-10-06 MED ORDER — METOPROLOL TARTRATE 25 MG PO TABS: 25.0000 mg | ORAL_TABLET | Freq: Two times a day (BID) | ORAL | 1 refills | Status: DC

## 2019-10-06 MED ORDER — ALLOPURINOL 300 MG PO TABS: ORAL_TABLET | ORAL | 3 refills | Status: DC

## 2019-10-06 MED ORDER — COLCHICINE 0.6 MG PO TABS: 0.6000 mg | ORAL_TABLET | Freq: Every day | ORAL | 1 refills | Status: DC | PRN

## 2019-10-06 MED ORDER — METFORMIN HCL ER (OSM) 500 MG PO TB24: 500.0000 mg | ORAL_TABLET | Freq: Every day | ORAL | 1 refills | Status: DC

## 2019-10-06 NOTE — Progress Notes (Signed)
Established Patient Office Visit  Subjective:  Patient ID: Steven Estrada, male    DOB: May 12, 1956  Age: 63 y.o. MRN: [id number]  CC:  Chief Complaint  Patient presents with  . Hypertension  . Diabetes  . Gout    HPI Steven Estrada presents for  Diabetes - no hypoglycemic events. No wounds or sores that are not healing well. No increased thirst or urination. Checking glucose at home. Taking medications as prescribed without any side effects.  Says the Trulicity is actually been working really well for him its really helped curb his appetite.  He is otherwise tolerated it well.  His weight is down 11 pounds since he was here in January.  Hypertension- Pt denies chest pain, SOB, dizziness, or heart palpitations.  Taking meds as directed w/o problems.  Denies medication side effects.    He also reports he has had a gout flare in his right knee since I last saw him.  Happened about 6 weeks ago.  He was taking some expired allopurinol he did try taking ibuprofen but it did not really help.  The flare lasted for about 4 to 5 days and was pretty painful and his knee was pretty swollen at the time.   Past Medical History:  Diagnosis Date  . Diabetes mellitus without complication ([place])   . Hx of cardiovascular stress test    a. ETT-Myoview 3/14:  EF 56%, + inferior ischemia  . Hyperlipidemia   . Hypertension     Past Surgical History:  Procedure Laterality Date  . CORONARY ANGIOPLASTY WITH STENT PLACEMENT  04/2013   Dr. Elonda Husky at [place]  . LEFT HEART CATHETERIZATION WITH CORONARY ANGIOGRAM N/A 01/31/2013   Procedure: LEFT HEART CATHETERIZATION WITH CORONARY ANGIOGRAM;  Surgeon: Peter M Martinique, MD;  Location: [facility] CATH LAB;  Service: Cardiovascular;  Laterality: N/A;    Family History  Problem Relation Age of Onset  . Heart failure Mother 2  . Hypertension Sister   . Hypertension Brother   . Hypertension Sister     Social History   Socioeconomic History  . Marital status:  Divorced    Spouse name: Not on file  . Number of children: 1  . Years of education: Not on file  . Highest education level: Not on file  Occupational History  . Occupation: TRUCK DRIVER    Employer: [employer]  Social Needs  . Financial resource strain: Not on file  . Food insecurity    Worry: Not on file    Inability: Not on file  . Transportation needs    Medical: Not on file    Non-medical: Not on file  Tobacco Use  . Smoking status: Never Smoker  . Smokeless tobacco: Never Used  Substance and Sexual Activity  . Alcohol use: Yes    Comment: 12 per month  . Drug use: No  . Sexual activity: Not on file  Lifestyle  . Physical activity    Days per week: Not on file    Minutes per session: Not on file  . Stress: Not on file  Relationships  . Social Herbalist on phone: Not on file    Gets together: Not on file    Attends religious service: Not on file    Active member of club or organization: Not on file    Attends meetings of clubs or organizations: Not on file    Relationship status: Not on file  . Intimate partner violence  Fear of current or ex partner: Not on file    Emotionally abused: Not on file    Physically abused: Not on file    Forced sexual activity: Not on file  Other Topics Concern  . Not on file  Social History Narrative   Works out [employer].     Outpatient Medications Prior to Visit  Medication Sig Dispense Refill  . clopidogrel (PLAVIX) 75 MG tablet     . Dulaglutide (TRULICITY) 0.75 MG/0.5ML SOPN INJECT 1 SYRINGE SUBCUTANEOUSLY ONCE A WEEK 12 mL 3  . fish oil-omega-3 fatty acids 1000 MG capsule Take 1 g by mouth daily.    . nitroGLYCERIN (NITROSTAT) 0.4 MG SL tablet Place 1 tablet (0.4 mg total) under the tongue every 5 (five) minutes as needed for chest pain. 12 tablet 1  . NON FORMULARY Glucometer, strips, and lancets to check dailly     . rosuvastatin (CRESTOR) 40 MG tablet Take 1 tablet (40 mg total) by mouth at bedtime. 90 tablet  3  . tadalafil (CIALIS) 20 MG tablet Take 20 mg by mouth daily as needed for erectile dysfunction.     Marland Kitchen. allopurinol (ZYLOPRIM) 300 MG tablet TAKE ONE & ONE-HALF TABLETS BY MOUTH ONCE DAILY 135 tablet 3  . amLODipine (NORVASC) 10 MG tablet Take 1 tablet (10 mg total) by mouth daily. 30 DAY SUPPLY GIVEN.APPOINTMENT AND LABS REQUIRED FOR REFILLS 30 tablet 0  . JANUMET XR 50-500 MG TB24 Take 1 tablet by mouth once daily 90 tablet 1  . metoprolol tartrate (LOPRESSOR) 25 MG tablet Take 1 tablet (25 mg total) by mouth 2 (two) times daily. LAST REFILL,30 DAY SUPPLY GIVEN.APPOINTMENT REQUIRED FOR ADDITIONAL REFILLS. 60 tablet 0  . olmesartan-hydrochlorothiazide (BENICAR HCT) 40-25 MG tablet Take 1 tablet by mouth daily. LAST REFILL,30 DAY SUPPLY GIVEN.APPOINTMENT REQUIRED FOR ADDITIONAL REFILLS. 30 tablet 0   No facility-administered medications prior to visit.     No Known Allergies  ROS Review of Systems    Objective:    Physical Exam  Constitutional: He is oriented to person, place, and time. He appears well-developed and well-nourished.  HENT:  Head: Normocephalic and atraumatic.  Cardiovascular: Normal rate, regular rhythm and normal heart sounds.  Pulmonary/Chest: Effort normal and breath sounds normal.  Neurological: He is alert and oriented to person, place, and time.  Skin: Skin is warm and dry.  Psychiatric: He has a normal mood and affect. His behavior is normal.    BP 121/78   Pulse 87   Ht 5\' 8"  (1.727 m)   Wt 198 lb (89.8 kg)   SpO2 100%   BMI 30.11 kg/m  Wt Readings from Last 3 Encounters:  10/06/19 198 lb (89.8 kg)  11/11/18 209 lb (94.8 kg)  08/13/18 212 lb (96.2 kg)     Health Maintenance Due  Topic Date Due  . HEMOGLOBIN A1C  05/12/2019  . OPHTHALMOLOGY EXAM  09/25/2019    There are no preventive care reminders to display for this patient.  Lab Results  Component Value Date   TSH 1.046 09/30/2012   Lab Results  Component Value Date   WBC 6.4  01/30/2013   HGB 13.2 01/30/2013   HCT 39.1 01/30/2013   MCV 80.5 01/30/2013   PLT 278.0 01/30/2013   Lab Results  Component Value Date   NA 137 08/13/2018   K 4.4 08/13/2018   CO2 31 08/13/2018   GLUCOSE 188 (H) 08/13/2018   BUN 17 08/13/2018   CREATININE 0.98 08/13/2018   BILITOT  0.4 08/13/2018   ALKPHOS 67 05/07/2017   AST 15 08/13/2018   ALT 16 08/13/2018   PROT 6.8 08/13/2018   ALBUMIN 4.4 05/07/2017   CALCIUM 9.6 08/13/2018   GFR 103.40 01/30/2013   Lab Results  Component Value Date   CHOL 150 01/07/2018   Lab Results  Component Value Date   HDL 30 (L) 01/07/2018   Lab Results  Component Value Date   LDLCALC 95 01/07/2018   Lab Results  Component Value Date   TRIG 153 (H) 01/07/2018   Lab Results  Component Value Date   CHOLHDL 5.0 (H) 01/07/2018   Lab Results  Component Value Date   HGBA1C 5.9 (A) 10/06/2019      Assessment & Plan:   Problem List Items Addressed This Visit      Cardiovascular and Mediastinum   HYPERTENSION, BENIGN    Well controlled. Continue current regimen. Follow up in  4 mo      Relevant Medications   olmesartan-hydrochlorothiazide (BENICAR HCT) 40-25 MG tablet   metoprolol tartrate (LOPRESSOR) 25 MG tablet   amLODipine (NORVASC) 10 MG tablet   Other Relevant Orders   COMPLETE METABOLIC PANEL WITH GFR   POCT HgB A1C (Completed)   Lipid Panel w/reflex Direct LDL   Uric acid     Endocrine   Diabetes mellitus without complication (HCC) - Primary    A1c looks fantastic today at 5.9.  In fact we will discontinue Januvia from the Janumet and just switch him to regular Metformin.  Eye exam scheduled for next month 4 months.      Relevant Medications   olmesartan-hydrochlorothiazide (BENICAR HCT) 40-25 MG tablet   metFORMIN (GLUCOPHAGE XR) 500 MG 24 hr tablet   Other Relevant Orders   COMPLETE METABOLIC PANEL WITH GFR   POCT HgB A1C (Completed)   Lipid Panel w/reflex Direct LDL   Uric acid     Other    Hyperlipidemia   Relevant Medications   olmesartan-hydrochlorothiazide (BENICAR HCT) 40-25 MG tablet   metoprolol tartrate (LOPRESSOR) 25 MG tablet   amLODipine (NORVASC) 10 MG tablet   Other Relevant Orders   COMPLETE METABOLIC PANEL WITH GFR   POCT HgB A1C (Completed)   Lipid Panel w/reflex Direct LDL   Uric acid   Gout    Send over new prescription for allopurinol asked that he can get back on his regular regimen.  Also send over prescription for colchicine just use for as needed for gout flares.      Relevant Orders   COMPLETE METABOLIC PANEL WITH GFR   POCT HgB A1C (Completed)   Lipid Panel w/reflex Direct LDL   Uric acid    Other Visit Diagnoses    Need for tetanus, diphtheria, and acellular pertussis (Tdap) vaccine in patient of adolescent age or older       Relevant Orders   Tdap vaccine greater than or equal to 7yo IM (Completed)   Need for immunization against influenza       Relevant Orders   Flu Vaccine QUAD 36+ mos IM (Completed)      Meds ordered this encounter  Medications  . olmesartan-hydrochlorothiazide (BENICAR HCT) 40-25 MG tablet    Sig: Take 1 tablet by mouth daily.    Dispense:  90 tablet    Refill:  1    Please fill for 90 day supply  . metoprolol tartrate (LOPRESSOR) 25 MG tablet    Sig: Take 1 tablet (25 mg total) by mouth 2 (two) times daily.  Dispense:  180 tablet    Refill:  1    Please fill for 90 day supply  . amLODipine (NORVASC) 10 MG tablet    Sig: Take 1 tablet (10 mg total) by mouth daily.    Dispense:  90 tablet    Refill:  1    Please fill for 90 day supply  . allopurinol (ZYLOPRIM) 300 MG tablet    Sig: TAKE ONE & ONE-HALF TABLETS BY MOUTH ONCE DAILY    Dispense:  135 tablet    Refill:  3  . DISCONTD: metformin (FORTAMET) 500 MG (OSM) 24 hr tablet    Sig: Take 1 tablet (500 mg total) by mouth daily with breakfast. Generic please    Dispense:  90 tablet    Refill:  1  . metFORMIN (GLUCOPHAGE XR) 500 MG 24 hr tablet     Sig: Take 1 tablet (500 mg total) by mouth daily with breakfast.    Dispense:  90 tablet    Refill:  1  . colchicine 0.6 MG tablet    Sig: Take 1 tablet (0.6 mg total) by mouth daily as needed.    Dispense:  30 tablet    Refill:  1    Follow-up: Return in about 4 months (around 02/03/2020) for Diabetes follow-up.    Nani Gasser, MD

## 2019-10-06 NOTE — Assessment & Plan Note (Addendum)
A1c looks fantastic today at 5.9.  In fact we will discontinue Januvia from the Sl[place] and just switch him to regular Metformin.  Eye exam scheduled for next month 4 months.

## 2019-10-06 NOTE — Assessment & Plan Note (Signed)
Well controlled. Continue current regimen. Follow up in  4 mo 

## 2019-10-06 NOTE — Assessment & Plan Note (Signed)
Send over new prescription for allopurinol asked that he can get back on his regular regimen.  Also send over prescription for colchicine just use for as needed for gout flares.

## 2020-01-13 ENCOUNTER — Encounter: Payer: Self-pay | Admitting: Emergency Medicine

## 2020-01-13 ENCOUNTER — Other Ambulatory Visit: Payer: Self-pay

## 2020-01-13 ENCOUNTER — Emergency Department (INDEPENDENT_AMBULATORY_CARE_PROVIDER_SITE_OTHER)
Admission: EM | Admit: 2020-01-13 | Discharge: 2020-01-13 | Disposition: A | Discharge status: Home / Self Care | Payer: BC Managed Care – PPO | Source: Home / Self Care | Attending: Family Medicine | Admitting: Family Medicine

## 2020-01-13 DIAGNOSIS — H6123 Impacted cerumen, bilateral: Secondary | ICD-10-CM

## 2020-01-13 NOTE — ED Triage Notes (Signed)
Pt states his right ear feels full. Started about 4 days ago. No pain.

## 2020-01-13 NOTE — Discharge Instructions (Addendum)
To prevent recurrent ear wax blockage, try the following: Soak two cotton balls with mineral oil, and gently place in each ear canal once weekly.  Leave the cotton balls in place for 10 to 20 minutes.  This will help liquefy the ear wax and aid your body's normal elimination process.  If applicable, do not use a hearing aid for 8 hours overnight.  Have your ears cleaned by a health professional every 6 to 12 months.  Avoid using "Q-tips" and ear wax softening solutions  

## 2020-01-13 NOTE — ED Provider Notes (Signed)
[facility]    CSN: [id number] Arrival date & time: 01/13/20  1644      History   Chief Complaint Chief Complaint  Patient presents with  . Ear Fullness    HPI Steven Estrada is a 64 y.o. male.   Patient complains of fullness (without pain) in his right ear for about 4 days.  The history is provided by the patient.  Ear Fullness This is a recurrent problem. The current episode started more than 2 days ago. The problem occurs constantly. The problem has not changed since onset.Nothing aggravates the symptoms. Nothing relieves the symptoms. He has tried nothing for the symptoms.    Past Medical History:  Diagnosis Date  . Diabetes mellitus without complication ([place])   . Hx of cardiovascular stress test    a. ETT-Myoview 3/14:  EF 56%, + inferior ischemia  . Hyperlipidemia   . Hypertension     Patient Active Problem List   Diagnosis Date Noted  . Coronary artery disease involving native coronary artery of native heart without angina pectoris 09/24/2018  . Abnormal stress test 05/26/2015  . Onychomycosis 09/01/2013  . Gout 09/01/2013  . CAD (coronary artery disease) 02/03/2013  . Diabetes mellitus without complication ([place]) 20/25/4270  . HYPERTENSION, BENIGN 02/02/2010  . HEMATURIA UNSPECIFIED 02/02/2010  . DERMATITIS, SEBORRHEIC 02/02/2010  . NOCTURIA 02/02/2010  . Hyperlipidemia 12/28/2008  . HYPERTROPHY PROSTATE W/O UR OBST & OTH LUTS 12/21/2008    Past Surgical History:  Procedure Laterality Date  . CORONARY ANGIOPLASTY WITH STENT PLACEMENT  04/2013   Dr. Elonda Husky at [place]  . LEFT HEART CATHETERIZATION WITH CORONARY ANGIOGRAM N/A 01/31/2013   Procedure: LEFT HEART CATHETERIZATION WITH CORONARY ANGIOGRAM;  Surgeon: Peter M Martinique, MD;  Location: [facility] CATH LAB;  Service: Cardiovascular;  Laterality: N/A;       Home Medications    Prior to Admission medications   Medication Sig Start Date End Date Taking? Authorizing Provider  allopurinol  (ZYLOPRIM) 300 MG tablet TAKE ONE & ONE-HALF TABLETS BY MOUTH ONCE DAILY 10/06/19   Hali Marry, MD  amLODipine (NORVASC) 10 MG tablet Take 1 tablet (10 mg total) by mouth daily. 10/06/19   Hali Marry, MD  clopidogrel (PLAVIX) 75 MG tablet  04/06/14   [provider]  colchicine 0.6 MG tablet Take 1 tablet (0.6 mg total) by mouth daily as needed. 10/06/19   Hali Marry, MD  Dulaglutide (TRULICITY) 6.23 JS/2.8BT SOPN INJECT 1 SYRINGE SUBCUTANEOUSLY ONCE A WEEK 03/10/19   Hali Marry, MD  fish oil-omega-3 fatty acids 1000 MG capsule Take 1 g by mouth daily.    [provider]  metFORMIN (GLUCOPHAGE XR) 500 MG 24 hr tablet Take 1 tablet (500 mg total) by mouth daily with breakfast. 10/06/19   Hali Marry, MD  metoprolol tartrate (LOPRESSOR) 25 MG tablet Take 1 tablet (25 mg total) by mouth 2 (two) times daily. 10/06/19   Hali Marry, MD  nitroGLYCERIN (NITROSTAT) 0.4 MG SL tablet Place 1 tablet (0.4 mg total) under the tongue every 5 (five) minutes as needed for chest pain. 09/09/15   Hali Marry, MD  NON FORMULARY Glucometer, strips, and lancets to check dailly     [provider]  olmesartan-hydrochlorothiazide (BENICAR HCT) 40-25 MG tablet Take 1 tablet by mouth daily. 10/06/19   Hali Marry, MD  rosuvastatin (CRESTOR) 40 MG tablet Take 1 tablet (40 mg total) by mouth at bedtime. 03/10/19   Hali Marry,  MD  tadalafil (CIALIS) 20 MG tablet Take 20 mg by mouth daily as needed for erectile dysfunction.     [provider]    Family History Family History  Problem Relation Age of Onset  . Heart failure Mother 64  . Hypertension Sister   . Hypertension Brother   . Hypertension Sister     Social History Social History   Tobacco Use  . Smoking status: Never Smoker  . Smokeless tobacco: Never Used  Substance Use Topics  . Alcohol use: Yes    Comment: 12 per month  . Drug  use: No     Allergies   Patient has no known allergies.   Review of Systems Review of Systems  Constitutional: Negative for chills, diaphoresis, fatigue and fever.  HENT: Positive for hearing loss. Negative for congestion, ear pain, sinus pressure, sore throat and tinnitus.   All other systems reviewed and are negative.    Physical Exam Triage Vital Signs ED Triage Vitals  Enc Vitals Group     BP 01/13/20 1702 103/69     Pulse Rate 01/13/20 1702 87     Resp --      Temp 01/13/20 1702 98 F (36.7 C)     Temp Source 01/13/20 1702 Oral     SpO2 01/13/20 1702 98 %     Weight 01/13/20 1703 200 lb (90.7 kg)     Height --      Head Circumference --      Peak Flow --      Pain Score 01/13/20 1702 0     Pain Loc --      Pain Edu? --      Excl. in GC? --    No data found.  Updated Vital Signs BP 103/69 (BP Location: Right Arm)   Pulse 87   Temp 98 F (36.7 C) (Oral)   Wt 90.7 kg   SpO2 98%   BMI 30.41 kg/m   Visual Acuity Right Eye Distance:   Left Eye Distance:   Bilateral Distance:    Right Eye Near:   Left Eye Near:    Bilateral Near:     Physical Exam Vitals and nursing note reviewed.  Constitutional:      General: He is not in acute distress. HENT:     Head: Normocephalic.     Comments: Right canal occluded with cerumen.  Left canal partly occluded with cerumen. After lavage, canals and tympanic membranes appear normal.    Nose: Nose normal.     Mouth/Throat:     Mouth: Mucous membranes are moist.  Eyes:     Pupils: Pupils are equal, round, and reactive to light.  Cardiovascular:     Rate and Rhythm: Normal rate.  Pulmonary:     Effort: Pulmonary effort is normal.  Skin:    General: Skin is warm and dry.  Neurological:     Mental Status: He is alert.      UC Treatments / Results  Labs (all labs ordered are listed, but only abnormal results are displayed) Labs Reviewed - No data to display  EKG   Radiology No results found.   Procedures Procedures (including critical care time)  Medications Ordered in UC Medications - No data to display  Initial Impression / Assessment and Plan / UC Course  I have reviewed the triage vital signs and the nursing notes.  Pertinent labs & imaging results that were available during my care of the patient were reviewed by  me and considered in my medical decision making (see chart for details).    Both ears lavaged by nurse without difficulty.   Final Clinical Impressions(s) / UC Diagnoses   Final diagnoses:  Bilateral impacted cerumen     Discharge Instructions      To prevent recurrent ear wax blockage, try the following: Soak two cotton balls with mineral oil, and gently place in each ear canal once weekly.  Leave the cotton balls in place for 10 to 20 minutes.  This will help liquefy the ear wax and aid your body's normal elimination process.  If applicable, do not use a hearing aid for 8 hours overnight.  Have your ears cleaned by a health professional every 6 to 12 months.  Avoid using "Q-tips" and ear wax softening solutions     ED Prescriptions    None     PDMP not reviewed this encounter.   Lattie Haw, MD 01/13/20 1755

## 2020-02-03 ENCOUNTER — Ambulatory Visit: Payer: Self-pay | Admitting: Family Medicine

## 2020-03-13 ENCOUNTER — Other Ambulatory Visit: Payer: Self-pay | Admitting: Family Medicine

## 2020-04-07 ENCOUNTER — Other Ambulatory Visit: Payer: Self-pay | Admitting: Family Medicine

## 2020-04-07 NOTE — Telephone Encounter (Signed)
Shanda Bumps to call patient and advise no refills until appointment is made.

## 2020-04-12 ENCOUNTER — Ambulatory Visit: Payer: BC Managed Care – PPO | Admitting: Family Medicine

## 2020-04-15 ENCOUNTER — Other Ambulatory Visit: Payer: Self-pay | Admitting: Family Medicine

## 2020-04-19 ENCOUNTER — Ambulatory Visit: Payer: BC Managed Care – PPO | Admitting: Family Medicine

## 2020-04-28 ENCOUNTER — Ambulatory Visit: Payer: BC Managed Care – PPO | Admitting: Family Medicine

## 2020-04-29 ENCOUNTER — Ambulatory Visit (INDEPENDENT_AMBULATORY_CARE_PROVIDER_SITE_OTHER): Payer: BC Managed Care – PPO | Admitting: Family Medicine

## 2020-04-29 ENCOUNTER — Other Ambulatory Visit: Payer: Self-pay

## 2020-04-29 ENCOUNTER — Encounter: Payer: Self-pay | Admitting: Family Medicine

## 2020-04-29 ENCOUNTER — Ambulatory Visit: Payer: BC Managed Care – PPO | Admitting: Family Medicine

## 2020-04-29 VITALS — BP 121/80 | HR 97 | Temp 97.6°F | Ht 68.11 in | Wt 204.9 lb

## 2020-04-29 DIAGNOSIS — E7800 Pure hypercholesterolemia, unspecified: Secondary | ICD-10-CM | POA: Diagnosis not present

## 2020-04-29 DIAGNOSIS — E119 Type 2 diabetes mellitus without complications: Secondary | ICD-10-CM

## 2020-04-29 DIAGNOSIS — R351 Nocturia: Secondary | ICD-10-CM | POA: Diagnosis not present

## 2020-04-29 DIAGNOSIS — I10 Essential (primary) hypertension: Secondary | ICD-10-CM | POA: Diagnosis not present

## 2020-04-29 DIAGNOSIS — I2510 Atherosclerotic heart disease of native coronary artery without angina pectoris: Secondary | ICD-10-CM

## 2020-04-29 LAB — COMPLETE METABOLIC PANEL WITH GFR
AG Ratio: 1.6 (calc) (ref 1.0–2.5)
ALT: 20 U/L (ref 9–46)
AST: 16 U/L (ref 10–35)
Albumin: 4.5 g/dL (ref 3.6–5.1)
Alkaline phosphatase (APISO): 67 U/L (ref 35–144)
BUN: 15 mg/dL (ref 7–25)
CO2: 31 mmol/L (ref 20–32)
Calcium: 9.9 mg/dL (ref 8.6–10.3)
Chloride: 102 mmol/L (ref 98–110)
Creat: 0.97 mg/dL (ref 0.70–1.25)
GFR, Est African American: 97 mL/min/{1.73_m2} (ref >=60)
GFR, Est Non African American: 83 mL/min/{1.73_m2} (ref >=60)
Globulin: 2.8 g/dL (calc) (ref 1.9–3.7)
Glucose, Bld: 157 mg/dL — ABNORMAL HIGH (ref 65–99)
Potassium: 4.6 mmol/L (ref 3.5–5.3)
Sodium: 138 mmol/L (ref 135–146)
Total Bilirubin: 0.5 mg/dL (ref 0.2–1.2)
Total Protein: 7.3 g/dL (ref 6.1–8.1)

## 2020-04-29 LAB — LIPID PANEL W/REFLEX DIRECT LDL
Cholesterol: 126 mg/dL (ref <200)
HDL: 39 mg/dL — ABNORMAL LOW (ref >=40)
LDL Cholesterol (Calc): 71 mg/dL (calc)
Non-HDL Cholesterol (Calc): 87 mg/dL (calc) (ref <130)
Total CHOL/HDL Ratio: 3.2 (calc) (ref <5.0)
Triglycerides: 81 mg/dL (ref <150)

## 2020-04-29 LAB — POCT GLYCOSYLATED HEMOGLOBIN (HGB A1C): Hemoglobin A1C: 6.4 % — AB (ref 4.0–5.6)

## 2020-04-29 MED ORDER — ALLOPURINOL 300 MG PO TABS: ORAL_TABLET | ORAL | 1 refills | Status: DC

## 2020-04-29 MED ORDER — OLMESARTAN MEDOXOMIL-HCTZ 40-25 MG PO TABS: 1.0000 | ORAL_TABLET | Freq: Every day | ORAL | 1 refills | Status: DC

## 2020-04-29 MED ORDER — TRULICITY 0.75 MG/0.5ML ~~LOC~~ SOAJ: 0.7500 mg | SUBCUTANEOUS | 1 refills | Status: AC

## 2020-04-29 MED ORDER — ROSUVASTATIN CALCIUM 40 MG PO TABS: 40.0000 mg | ORAL_TABLET | Freq: Every day | ORAL | 1 refills | Status: DC

## 2020-04-29 MED ORDER — METOPROLOL TARTRATE 25 MG PO TABS: 25.0000 mg | ORAL_TABLET | Freq: Two times a day (BID) | ORAL | 1 refills | Status: DC

## 2020-04-29 MED ORDER — METFORMIN HCL ER 500 MG PO TB24: 500.0000 mg | ORAL_TABLET | Freq: Every day | ORAL | 1 refills | Status: DC

## 2020-04-29 NOTE — Assessment & Plan Note (Signed)
Stable at this time.  He will continue to see cardiology as well.

## 2020-04-29 NOTE — Assessment & Plan Note (Signed)
Most recent A1c of  Lab Results  Component Value Date   HGBA1C 6.4 (A) 04/29/2020  A1c is up slightly since last check, but has been out of medications for a couple of weeks.  He will restart medications of metformin and trulicity.  Counseled on healthy, low carb diet and recommend frequent activity to help with maintaining good control of blood sugars.

## 2020-04-29 NOTE — Patient Instructions (Signed)
Very nice to meet you today! Continue current medications.  Have a copy of your recent eye exam sent over.  Have labs completed.  We'll be in touch with results.  Follow up in  6 months or sooner if needed.

## 2020-04-29 NOTE — Assessment & Plan Note (Signed)
Blood pressure is at goal at for age and co-morbidities.  I recommend continuation of current medications.  In addition they were instructed to follow a low sodium diet with regular exercise to help to maintain adequate control of blood pressure.  ? ?

## 2020-04-29 NOTE — Assessment & Plan Note (Signed)
Tolerating crestor well, recommend continuation.  Update lipid panel.

## 2020-04-29 NOTE — Progress Notes (Signed)
Steven Estrada - 64 y.o. male MRN [id number]  Date of birth: September 19, 1956  Subjective Chief Complaint  Patient presents with  . Medication Refill    HPI Steven Estrada is a 64 y.o. male with history of HTN, T2DM with HLD, CAD and BPH here today for follow up visit.    -HTN/CAD:  Current management with amlodipine, metoprolol and benicar-hct.   He is also followed by cardiology through [facility].  He has a history of stent placement in 2014. He remains on dual anti-platelet therapy with plavix and asa.  He is tolerating crestor well at current dose. He denies chest pain, shortness of breath, palpitations.  -T2DM:  Current treatment with metformin and trulicity.  He is not checking blood sugars at home. Diabetes has bene well controlled with current medications with previous a1c of 5.9%.  He denies symptoms related to diabetes including increased thirst or urination.  He had eye exam recently as well.  ROS:  A comprehensive ROS was completed and negative except as noted per HPI  No Known Allergies  Past Medical History:  Diagnosis Date  . Diabetes mellitus without complication ([place])   . Hx of cardiovascular stress test    a. ETT-Myoview 3/14:  EF 56%, + inferior ischemia  . Hyperlipidemia   . Hypertension     Past Surgical History:  Procedure Laterality Date  . CORONARY ANGIOPLASTY WITH STENT PLACEMENT  04/2013   Dr. Elonda Husky at [place]  . LEFT HEART CATHETERIZATION WITH CORONARY ANGIOGRAM N/A 01/31/2013   Procedure: LEFT HEART CATHETERIZATION WITH CORONARY ANGIOGRAM;  Surgeon: Peter M Martinique, MD;  Location: [facility] CATH LAB;  Service: Cardiovascular;  Laterality: N/A;    Social History   Socioeconomic History  . Marital status: Divorced    Spouse name: Not on file  . Number of children: 1  . Years of education: Not on file  . Highest education level: Not on file  Occupational History  . Occupation: TRUCK DRIVER    Employer: [employer]  Tobacco Use  . Smoking status: Never Smoker   . Smokeless tobacco: Never Used  Vaping Use  . Vaping Use: Never used  Substance and Sexual Activity  . Alcohol use: Yes    Comment: 12 per month  . Drug use: No  . Sexual activity: Not on file  Other Topics Concern  . Not on file  Social History Narrative   Works out [employer].    Social Determinants of Health   Financial Resource Strain:   . Difficulty of Paying Living Expenses:   Food Insecurity:   . Worried About Charity fundraiser in the Last Year:   . Arboriculturist in the Last Year:   Transportation Needs:   . Film/video editor (Medical):   Marland Kitchen Lack of Transportation (Non-Medical):   Physical Activity:   . Days of Exercise per Week:   . Minutes of Exercise per Session:   Stress:   . Feeling of Stress :   Social Connections:   . Frequency of Communication with Friends and Family:   . Frequency of Social Gatherings with Friends and Family:   . Attends Religious Services:   . Active Member of Clubs or Organizations:   . Attends Archivist Meetings:   Marland Kitchen Marital Status:     Family History  Problem Relation Age of Onset  . Heart failure Mother 60  . Hypertension Sister   . Hypertension Brother   . Hypertension Sister  Health Maintenance  Topic Date Due  . COVID-19 Vaccine (1) Never done  . OPHTHALMOLOGY EXAM  09/25/2019  . COLONOSCOPY  04/19/2020  . INFLUENZA VACCINE  06/06/2020  . HEMOGLOBIN A1C  10/29/2020  . FOOT EXAM  04/29/2021  . TETANUS/TDAP  10/05/2029  . PNEUMOCOCCAL POLYSACCHARIDE VACCINE AGE 38-64 HIGH RISK  Completed  . Hepatitis C Screening  Completed  . HIV Screening  Completed     ----------------------------------------------------------------------------------------------------------------------------------------------------------------------------------------------------------------- Physical Exam BP 121/80 (BP Location: Left Arm, Patient Position: Sitting, Cuff Size: Normal)   Pulse 97   Temp 97.6 F (36.4 C)   Ht  5' 8.11" (1.73 m)   Wt 204 lb 13.9 oz (92.9 kg)   SpO2 97%   BMI 31.05 kg/m   Physical Exam Constitutional:      Appearance: Normal appearance.  HENT:     Head: Normocephalic and atraumatic.  Eyes:     General: No scleral icterus. Cardiovascular:     Rate and Rhythm: Normal rate and regular rhythm.     Heart sounds: Murmur (2/6 systolic murmur noted.  ) heard.   Pulmonary:     Effort: Pulmonary effort is normal.     Breath sounds: Normal breath sounds.  Musculoskeletal:     Cervical back: Neck supple.  Skin:    General: Skin is warm and dry.  Neurological:     General: No focal deficit present.     Mental Status: He is alert.  Psychiatric:        Mood and Affect: Mood normal.        Behavior: Behavior normal.    Diabetic Foot Exam - Simple   Simple Foot Form Diabetic Foot exam was performed with the following findings: Yes 04/29/2020 11:30 AM  Visual Inspection No deformities, no ulcerations, no other skin breakdown bilaterally: Yes Sensation Testing Intact to touch and monofilament testing bilaterally: Yes Pulse Check Posterior Tibialis and Dorsalis pulse intact bilaterally: Yes Comments     ------------------------------------------------------------------------------------------------------------------------------------------------------------------------------------------------------------------- Assessment and Plan  Diabetes mellitus without complication Most recent A1c of  Lab Results  Component Value Date   HGBA1C 6.4 (A) 04/29/2020  A1c is up slightly since last check, but has been out of medications for a couple of weeks.  He will restart medications of metformin and trulicity.  Counseled on healthy, low carb diet and recommend frequent activity to help with maintaining good control of blood sugars.    Hyperlipidemia Tolerating crestor well, recommend continuation.  Update lipid panel.   HYPERTENSION, BENIGN Blood pressure is at goal at for age and  co-morbidities.  I recommend continuation of current medications.  In addition they were instructed to follow a low sodium diet with regular exercise to help to maintain adequate control of blood pressure.    CAD (coronary artery disease) Stable at this time.  He will continue to see cardiology as well.     Meds ordered this encounter  Medications  . Dulaglutide (TRULICITY) 0.75 MG/0.5ML SOPN    Sig: Inject 0.5 mLs (0.75 mg total) into the skin once a week.    Dispense:  12 pen    Refill:  1  . metFORMIN (GLUCOPHAGE XR) 500 MG 24 hr tablet    Sig: Take 1 tablet (500 mg total) by mouth daily with breakfast.    Dispense:  90 tablet    Refill:  1  . metoprolol tartrate (LOPRESSOR) 25 MG tablet    Sig: Take 1 tablet (25 mg total) by mouth 2 (two) times daily.  Dispense:  180 tablet    Refill:  1    Please fill for 90 day supply  . olmesartan-hydrochlorothiazide (BENICAR HCT) 40-25 MG tablet    Sig: Take 1 tablet by mouth daily.    Dispense:  90 tablet    Refill:  1    Please fill for 90 day supply  . rosuvastatin (CRESTOR) 40 MG tablet    Sig: Take 1 tablet (40 mg total) by mouth at bedtime.    Dispense:  90 tablet    Refill:  1  . allopurinol (ZYLOPRIM) 300 MG tablet    Sig: TAKE ONE & ONE-HALF TABLETS BY MOUTH ONCE DAILY    Dispense:  135 tablet    Refill:  1    Return in about 6 months (around 10/29/2020) for T2DM/HTN.    This visit occurred during the SARS-CoV-2 public health emergency.  Safety protocols were in place, including screening questions prior to the visit, additional usage of staff PPE, and extensive cleaning of exam room while observing appropriate contact time as indicated for disinfecting solutions.

## 2020-04-30 ENCOUNTER — Ambulatory Visit: Payer: BC Managed Care – PPO | Admitting: Family Medicine

## 2020-04-30 LAB — PSA: PSA: 0.6 ng/mL (ref <=4.0)

## 2020-07-17 ENCOUNTER — Other Ambulatory Visit: Payer: Self-pay | Admitting: Family Medicine

## 2020-07-30 ENCOUNTER — Telehealth: Payer: Self-pay

## 2020-07-30 NOTE — Telephone Encounter (Signed)
Patient is scheduled for a colonoscopy on 08/09/2020.  They need him to stop his Coumadin on 08/03/2020 and need documentation from Dr. Linford Arnold that this is OK.    Dr. Tera Mater [phone] F [phone]

## 2020-07-30 NOTE — Telephone Encounter (Signed)
?   Plavix?  If yes, okay to hold per recommendation above.

## 2020-08-02 ENCOUNTER — Encounter: Payer: Self-pay | Admitting: Family Medicine

## 2020-08-02 NOTE — Telephone Encounter (Signed)
Faxed letter to Dr Terri Skains office - [phone]

## 2020-08-09 DIAGNOSIS — K648 Other hemorrhoids: Secondary | ICD-10-CM | POA: Diagnosis not present

## 2020-08-09 DIAGNOSIS — Z1211 Encounter for screening for malignant neoplasm of colon: Secondary | ICD-10-CM | POA: Diagnosis not present

## 2020-08-09 LAB — HM COLONOSCOPY

## 2020-09-03 ENCOUNTER — Other Ambulatory Visit: Payer: Self-pay

## 2020-09-03 MED ORDER — TRULICITY 0.75 MG/0.5ML ~~LOC~~ SOAJ: 0.7500 mg | SUBCUTANEOUS | 3 refills | Status: DC

## 2020-09-06 ENCOUNTER — Telehealth: Payer: Self-pay | Admitting: *Deleted

## 2020-09-06 NOTE — Telephone Encounter (Signed)
Prior auth submitted for continuing Trulicity.   Awaiting response.

## 2020-10-13 ENCOUNTER — Other Ambulatory Visit: Payer: Self-pay | Admitting: Family Medicine

## 2020-10-18 DIAGNOSIS — I2510 Atherosclerotic heart disease of native coronary artery without angina pectoris: Secondary | ICD-10-CM | POA: Diagnosis not present

## 2020-10-18 DIAGNOSIS — I10 Essential (primary) hypertension: Secondary | ICD-10-CM | POA: Diagnosis not present

## 2020-10-18 DIAGNOSIS — E119 Type 2 diabetes mellitus without complications: Secondary | ICD-10-CM | POA: Diagnosis not present

## 2020-10-25 ENCOUNTER — Encounter: Payer: Self-pay | Admitting: Family Medicine

## 2020-10-25 ENCOUNTER — Ambulatory Visit (INDEPENDENT_AMBULATORY_CARE_PROVIDER_SITE_OTHER): Payer: BC Managed Care – PPO | Admitting: Family Medicine

## 2020-10-25 ENCOUNTER — Other Ambulatory Visit: Payer: Self-pay

## 2020-10-25 VITALS — BP 131/74 | HR 78 | Ht 68.0 in | Wt 209.0 lb

## 2020-10-25 DIAGNOSIS — M1A09X0 Idiopathic chronic gout, multiple sites, without tophus (tophi): Secondary | ICD-10-CM

## 2020-10-25 DIAGNOSIS — E119 Type 2 diabetes mellitus without complications: Secondary | ICD-10-CM | POA: Diagnosis not present

## 2020-10-25 DIAGNOSIS — E7800 Pure hypercholesterolemia, unspecified: Secondary | ICD-10-CM | POA: Diagnosis not present

## 2020-10-25 DIAGNOSIS — E118 Type 2 diabetes mellitus with unspecified complications: Secondary | ICD-10-CM

## 2020-10-25 DIAGNOSIS — I10 Essential (primary) hypertension: Secondary | ICD-10-CM

## 2020-10-25 DIAGNOSIS — I2510 Atherosclerotic heart disease of native coronary artery without angina pectoris: Secondary | ICD-10-CM

## 2020-10-25 LAB — BASIC METABOLIC PANEL WITH GFR
BUN: 21 mg/dL (ref 7–25)
CO2: 30 mmol/L (ref 20–32)
Calcium: 10.2 mg/dL (ref 8.6–10.3)
Chloride: 103 mmol/L (ref 98–110)
Creat: 1.1 mg/dL (ref 0.70–1.25)
GFR, Est African American: 83 mL/min/{1.73_m2} (ref >=60)
GFR, Est Non African American: 72 mL/min/{1.73_m2} (ref >=60)
Glucose, Bld: 110 mg/dL — ABNORMAL HIGH (ref 65–99)
Potassium: 4.4 mmol/L (ref 3.5–5.3)
Sodium: 140 mmol/L (ref 135–146)

## 2020-10-25 LAB — POCT GLYCOSYLATED HEMOGLOBIN (HGB A1C): Hemoglobin A1C: 6.5 % — AB (ref 4.0–5.6)

## 2020-10-25 LAB — URIC ACID: Uric Acid, Serum: 7.2 mg/dL (ref 4.0–8.0)

## 2020-10-25 MED ORDER — ROSUVASTATIN CALCIUM 40 MG PO TABS: 40.0000 mg | ORAL_TABLET | Freq: Every day | ORAL | 1 refills | Status: DC

## 2020-10-25 MED ORDER — METFORMIN HCL ER 500 MG PO TB24: 500.0000 mg | ORAL_TABLET | Freq: Every day | ORAL | 1 refills | Status: DC

## 2020-10-25 MED ORDER — METOPROLOL TARTRATE 25 MG PO TABS: 25.0000 mg | ORAL_TABLET | Freq: Two times a day (BID) | ORAL | 1 refills | Status: DC

## 2020-10-25 MED ORDER — TRULICITY 0.75 MG/0.5ML ~~LOC~~ SOAJ: 0.7500 mg | SUBCUTANEOUS | 3 refills | Status: DC

## 2020-10-25 MED ORDER — COLCHICINE 0.6 MG PO TABS: 0.6000 mg | ORAL_TABLET | Freq: Every day | ORAL | 1 refills | Status: DC | PRN

## 2020-10-25 MED ORDER — OLMESARTAN MEDOXOMIL-HCTZ 40-25 MG PO TABS: 1.0000 | ORAL_TABLET | Freq: Every day | ORAL | 1 refills | Status: DC

## 2020-10-25 MED ORDER — ALLOPURINOL 300 MG PO TABS: ORAL_TABLET | ORAL | 1 refills | Status: DC

## 2020-10-25 NOTE — Assessment & Plan Note (Signed)
Couple a mild flare since I last saw him. Plan to recheck uric acid level today.

## 2020-10-25 NOTE — Assessment & Plan Note (Signed)
1 see looks great today at 6.5. Just continue to work on healthy diet regular exercise and follow-up in 4 to 6 months. He is doing well with the Trulicity and has not had any issues getting it from the pharmacy. Continue statin and ARB.

## 2020-10-25 NOTE — Assessment & Plan Note (Signed)
DL was just borderline at goal. It was 71 this summer. Cardiologist encouraged him to continue to work on losing some weight and getting regular exercise and continuing to eat healthy to get it to goal and continue with just the 40 mg of Crestor for now.

## 2020-10-25 NOTE — Progress Notes (Signed)
Established Patient Office Visit  Subjective:  Patient ID: Steven Estrada, male    DOB: 1956/05/07  Age: 64 y.o. MRN: [id number]  CC:  Chief Complaint  Patient presents with  . Diabetes  . Hypertension    HPI Steven Estrada presents for   Hypertension- Pt denies chest pain, SOB, dizziness, or heart palpitations.  Taking meds as directed w/o problems.  Denies medication side effects.  Needs refills today.    Diabetes - no hypoglycemic events. No wounds or sores that are not healing well. No increased thirst or urination. Checking glucose at home. Taking medications as prescribed without any side effects.  Eye exam is scheduled for next week.  He also has a lesion in the right corner of his lower lip right x3 days along the vermilion border. No pain irritation or itching he wonders if it's a cold sore that he has never had a cold sore before.  Hyperlipidemia - tolerating stating well with no myalgias or significant side effects.  Lab Results  Component Value Date   CHOL 126 04/29/2020   HDL 39 (L) 04/29/2020   LDLCALC 71 04/29/2020   LDLDIRECT 125 (H) 06/22/2014   TRIG 81 04/29/2020   CHOLHDL 3.2 04/29/2020    Gout-he has had a few mild flares nothing severe. Does need a refill on his colchicine today.   Past Medical History:  Diagnosis Date  . Diabetes mellitus without complication (HCC)   . Hx of cardiovascular stress test    a. ETT-Myoview 3/14:  EF 56%, + inferior ischemia  . Hyperlipidemia   . Hypertension     Past Surgical History:  Procedure Laterality Date  . CORONARY ANGIOPLASTY WITH STENT PLACEMENT  04/2013   Dr. Chales Abrahams at [place]  . LEFT HEART CATHETERIZATION WITH CORONARY ANGIOGRAM N/A 01/31/2013   Procedure: LEFT HEART CATHETERIZATION WITH CORONARY ANGIOGRAM;  Surgeon: Peter M Swaziland, MD;  Location: [facility] CATH LAB;  Service: Cardiovascular;  Laterality: N/A;    Family History  Problem Relation Age of Onset  . Heart failure Mother 70  . Hypertension  Sister   . Hypertension Brother   . Hypertension Sister     Social History   Socioeconomic History  . Marital status: Divorced    Spouse name: Not on file  . Number of children: 1  . Years of education: Not on file  . Highest education level: Not on file  Occupational History  . Occupation: TRUCK DRIVER    Employer: [employer]  Tobacco Use  . Smoking status: Never Smoker  . Smokeless tobacco: Never Used  Vaping Use  . Vaping Use: Never used  Substance and Sexual Activity  . Alcohol use: Yes    Comment: 12 per month  . Drug use: No  . Sexual activity: Not on file  Other Topics Concern  . Not on file  Social History Narrative   Works out [employer].    Social Determinants of Health   Financial Resource Strain: Not on file  Food Insecurity: Not on file  Transportation Needs: Not on file  Physical Activity: Not on file  Stress: Not on file  Social Connections: Not on file  Intimate Partner Violence: Not on file    Outpatient Medications Prior to Visit  Medication Sig Dispense Refill  . amLODipine (NORVASC) 10 MG tablet TAKE 1 TABLET BY MOUTH ONCE DAILY . APPOINTMENT REQUIRED FOR FUTURE REFILLS 90 tablet 0  . clopidogrel (PLAVIX) 75 MG tablet     . fish oil-omega-3 fatty  acids 1000 MG capsule Take 1 g by mouth daily.    . nitroGLYCERIN (NITROSTAT) 0.4 MG SL tablet Place 1 tablet (0.4 mg total) under the tongue every 5 (five) minutes as needed for chest pain. 12 tablet 1  . NON FORMULARY Glucometer, strips, and lancets to check dailly    . tadalafil (CIALIS) 20 MG tablet Take 20 mg by mouth daily as needed for erectile dysfunction.    Marland Kitchen. allopurinol (ZYLOPRIM) 300 MG tablet TAKE ONE & ONE-HALF TABLETS BY MOUTH ONCE DAILY 135 tablet 1  . colchicine 0.6 MG tablet Take 1 tablet (0.6 mg total) by mouth daily as needed. 30 tablet 1  . metFORMIN (GLUCOPHAGE XR) 500 MG 24 hr tablet Take 1 tablet (500 mg total) by mouth daily with breakfast. 90 tablet 1  . metoprolol tartrate  (LOPRESSOR) 25 MG tablet Take 1 tablet (25 mg total) by mouth 2 (two) times daily. 180 tablet 1  . olmesartan-hydrochlorothiazide (BENICAR HCT) 40-25 MG tablet Take 1 tablet by mouth daily. 90 tablet 1  . rosuvastatin (CRESTOR) 40 MG tablet Take 1 tablet (40 mg total) by mouth at bedtime. 90 tablet 1  . TRULICITY 0.75 MG/0.5ML SOPN Inject 0.75 mg into the skin once a week. 2 mL 3   No facility-administered medications prior to visit.    No Known Allergies  ROS Review of Systems    Objective:    Physical Exam Constitutional:      Appearance: He is well-developed and well-nourished.  HENT:     Head: Normocephalic and atraumatic.  Cardiovascular:     Rate and Rhythm: Normal rate and regular rhythm.     Heart sounds: Normal heart sounds.  Pulmonary:     Effort: Pulmonary effort is normal.     Breath sounds: Normal breath sounds.  Skin:    General: Skin is warm and dry.  Neurological:     Mental Status: He is alert and oriented to person, place, and time.  Psychiatric:        Mood and Affect: Mood and affect normal.        Behavior: Behavior normal.     BP 131/74   Pulse 78   Ht 5\' 8"  (1.727 m)   Wt 209 lb (94.8 kg)   SpO2 100%   BMI 31.78 kg/m  Wt Readings from Last 3 Encounters:  10/25/20 209 lb (94.8 kg)  04/29/20 204 lb 13.9 oz (92.9 kg)  01/13/20 200 lb (90.7 kg)     There are no preventive care reminders to display for this patient.  There are no preventive care reminders to display for this patient.  Lab Results  Component Value Date   TSH 1.046 09/30/2012   Lab Results  Component Value Date   WBC 6.4 01/30/2013   HGB 13.2 01/30/2013   HCT 39.1 01/30/2013   MCV 80.5 01/30/2013   PLT 278.0 01/30/2013   Lab Results  Component Value Date   NA 138 04/29/2020   K 4.6 04/29/2020   CO2 31 04/29/2020   GLUCOSE 157 (H) 04/29/2020   BUN 15 04/29/2020   CREATININE 0.97 04/29/2020   BILITOT 0.5 04/29/2020   ALKPHOS 67 05/07/2017   AST 16 04/29/2020    ALT 20 04/29/2020   PROT 7.3 04/29/2020   ALBUMIN 4.4 05/07/2017   CALCIUM 9.9 04/29/2020   GFR 103.40 01/30/2013   Lab Results  Component Value Date   CHOL 126 04/29/2020   Lab Results  Component Value Date   HDL  39 (L) 04/29/2020   Lab Results  Component Value Date   LDLCALC 71 04/29/2020   Lab Results  Component Value Date   TRIG 81 04/29/2020   Lab Results  Component Value Date   CHOLHDL 3.2 04/29/2020   Lab Results  Component Value Date   HGBA1C 6.5 (A) 10/25/2020      Assessment & Plan:   Problem List Items Addressed This Visit      Cardiovascular and Mediastinum   HYPERTENSION, BENIGN    Well controlled. Continue current regimen. Follow up in  6 mo. Due for BMP.      Relevant Medications   metoprolol tartrate (LOPRESSOR) 25 MG tablet   olmesartan-hydrochlorothiazide (BENICAR HCT) 40-25 MG tablet   rosuvastatin (CRESTOR) 40 MG tablet   Other Relevant Orders   BASIC METABOLIC PANEL WITH GFR   CAD (coronary artery disease)    DL was just borderline at goal. It was 71 this summer. Cardiologist encouraged him to continue to work on losing some weight and getting regular exercise and continuing to eat healthy to get it to goal and continue with just the 40 mg of Crestor for now.      Relevant Medications   metoprolol tartrate (LOPRESSOR) 25 MG tablet   olmesartan-hydrochlorothiazide (BENICAR HCT) 40-25 MG tablet   rosuvastatin (CRESTOR) 40 MG tablet     Endocrine   Controlled diabetes mellitus type 2 with complications (HCC) - Primary    1 see looks great today at 6.5. Just continue to work on healthy diet regular exercise and follow-up in 4 to 6 months. He is doing well with the Trulicity and has not had any issues getting it from the pharmacy. Continue statin and ARB.      Relevant Medications   olmesartan-hydrochlorothiazide (BENICAR HCT) 40-25 MG tablet   metFORMIN (GLUCOPHAGE XR) 500 MG 24 hr tablet   TRULICITY 0.75 MG/0.5ML SOPN    rosuvastatin (CRESTOR) 40 MG tablet     Other   Hyperlipidemia   Relevant Medications   metoprolol tartrate (LOPRESSOR) 25 MG tablet   olmesartan-hydrochlorothiazide (BENICAR HCT) 40-25 MG tablet   rosuvastatin (CRESTOR) 40 MG tablet   Gout    Couple a mild flare since I last saw him. Plan to recheck uric acid level today.      Relevant Orders   Uric acid      Meds ordered this encounter  Medications  . allopurinol (ZYLOPRIM) 300 MG tablet    Sig: TAKE ONE & ONE-HALF TABLETS BY MOUTH ONCE DAILY    Dispense:  135 tablet    Refill:  1  . colchicine 0.6 MG tablet    Sig: Take 1 tablet (0.6 mg total) by mouth daily as needed.    Dispense:  30 tablet    Refill:  1  . metoprolol tartrate (LOPRESSOR) 25 MG tablet    Sig: Take 1 tablet (25 mg total) by mouth 2 (two) times daily.    Dispense:  180 tablet    Refill:  1    Please fill for 90 day supply  . olmesartan-hydrochlorothiazide (BENICAR HCT) 40-25 MG tablet    Sig: Take 1 tablet by mouth daily.    Dispense:  90 tablet    Refill:  1    Please fill for 90 day supply  . metFORMIN (GLUCOPHAGE XR) 500 MG 24 hr tablet    Sig: Take 1 tablet (500 mg total) by mouth daily with breakfast.    Dispense:  90 tablet  Refill:  1  . TRULICITY 0.75 MG/0.5ML SOPN    Sig: Inject 0.75 mg into the skin once a week.    Dispense:  2 mL    Refill:  3  . rosuvastatin (CRESTOR) 40 MG tablet    Sig: Take 1 tablet (40 mg total) by mouth at bedtime.    Dispense:  90 tablet    Refill:  1    Follow-up: Return in about 6 months (around 04/25/2021) for Diabetes follow-up.    Nani Gasser, MD

## 2020-10-25 NOTE — Assessment & Plan Note (Signed)
Well controlled. Continue current regimen. Follow up in  6 mo . Due for BMP 

## 2021-01-16 ENCOUNTER — Other Ambulatory Visit: Payer: Self-pay | Admitting: Family Medicine

## 2021-03-12 ENCOUNTER — Other Ambulatory Visit: Payer: Self-pay | Admitting: Family Medicine

## 2021-04-02 ENCOUNTER — Other Ambulatory Visit: Payer: Self-pay | Admitting: Family Medicine

## 2021-04-14 ENCOUNTER — Other Ambulatory Visit: Payer: Self-pay | Admitting: Family Medicine

## 2021-04-25 ENCOUNTER — Ambulatory Visit: Payer: BC Managed Care – PPO | Admitting: Family Medicine

## 2021-05-02 ENCOUNTER — Ambulatory Visit: Payer: BC Managed Care – PPO | Admitting: Family Medicine

## 2021-05-14 ENCOUNTER — Other Ambulatory Visit: Payer: Self-pay | Admitting: Family Medicine

## 2021-05-16 ENCOUNTER — Other Ambulatory Visit: Payer: Self-pay

## 2021-05-16 ENCOUNTER — Ambulatory Visit (INDEPENDENT_AMBULATORY_CARE_PROVIDER_SITE_OTHER): Payer: BC Managed Care – PPO | Admitting: Family Medicine

## 2021-05-16 ENCOUNTER — Encounter: Payer: Self-pay | Admitting: Family Medicine

## 2021-05-16 VITALS — BP 115/65 | HR 82 | Ht 68.0 in | Wt 211.0 lb

## 2021-05-16 DIAGNOSIS — E118 Type 2 diabetes mellitus with unspecified complications: Secondary | ICD-10-CM

## 2021-05-16 DIAGNOSIS — M1A09X0 Idiopathic chronic gout, multiple sites, without tophus (tophi): Secondary | ICD-10-CM

## 2021-05-16 DIAGNOSIS — Z125 Encounter for screening for malignant neoplasm of prostate: Secondary | ICD-10-CM | POA: Diagnosis not present

## 2021-05-16 DIAGNOSIS — I10 Essential (primary) hypertension: Secondary | ICD-10-CM

## 2021-05-16 DIAGNOSIS — I2510 Atherosclerotic heart disease of native coronary artery without angina pectoris: Secondary | ICD-10-CM | POA: Diagnosis not present

## 2021-05-16 DIAGNOSIS — E119 Type 2 diabetes mellitus without complications: Secondary | ICD-10-CM

## 2021-05-16 LAB — POCT GLYCOSYLATED HEMOGLOBIN (HGB A1C): Hemoglobin A1C: 6.4 % — AB (ref 4.0–5.6)

## 2021-05-16 MED ORDER — OLMESARTAN-AMLODIPINE-HCTZ 40-5-25 MG PO TABS: 1.0000 | ORAL_TABLET | Freq: Every day | ORAL | 1 refills | Status: DC

## 2021-05-16 NOTE — Assessment & Plan Note (Signed)
No recent flares or exacerbations.  

## 2021-05-16 NOTE — Progress Notes (Signed)
Established Patient Office Visit  Subjective:  Patient ID: Steven Estrada, male    DOB: [birth year]  Age: 65 y.o. MRN: [id number]  CC:  Chief Complaint  Patient presents with   Diabetes    HPI Steven Estrada presents for Diabetes - no hypoglycemic events. No wounds or sores that are not healing well. No increased thirst or urination. Checking glucose at home. Taking medications as prescribed without any side effects.  He says he has an eye exam scheduled.  Hypertension- Pt denies chest pain, SOB, dizziness, or heart palpitations.  Taking meds as directed w/o problems.  Denies medication side effects.     Past Medical History:  Diagnosis Date   Diabetes mellitus without complication (HCC)    Hx of cardiovascular stress test    a. ETT-Myoview 3/14:  EF 56%, + inferior ischemia   Hyperlipidemia    Hypertension     Past Surgical History:  Procedure Laterality Date   CORONARY ANGIOPLASTY WITH STENT PLACEMENT  04/2013   Dr. Chales Abrahams at [facility] CATHETERIZATION WITH CORONARY ANGIOGRAM N/A 01/31/2013   Procedure: LEFT HEART CATHETERIZATION WITH CORONARY ANGIOGRAM;  Surgeon: Peter M Swaz[place]and, MD;  Location: [facility] CATH LAB;  Service: Cardiovascular;  Laterality: N/A;    Family History  Problem Relation Age of Onset   Heart failure Mother 39   Hypertension Sister    Hypertension Brother    Hypertension Sister     Social History   Socioeconomic History   Marital status: Divorced    Spouse name: Not on file   Number of children: 1   Years of education: Not on file   Highest education level: Not on file  Occupational History   Occupation: TRUCK DRIVER    Employer: [employer]  Tobacco Use   Smoking status: Never   Smokeless tobacco: Never  Vaping Use   Vaping Use: Never used  Substance and Sexual Activity   Alcohol use: Yes    Comment: 12 per month   Drug use: No   Sexual activity: Not on file  Other Topics Concern   Not on file  Social History Narrative   Works  out Architect.    Social Determinants of Health   Financial Resource Strain: Not on file  Food Insecurity: Not on file  Transportation Needs: Not on file  Physical Activity: Not on file  Stress: Not on file  Social Connections: Not on file  Intimate Partner Violence: Not on file    Outpatient Medications Prior to Visit  Medication Sig Dispense Refill   allopurinol (ZYLOPRIM) 300 MG tablet TAKE ONE & ONE-HALF TABLETS BY MOUTH ONCE DAILY 135 tablet 1   clopidogrel (PLAVIX) 75 MG tablet      colchicine 0.6 MG tablet Take 1 tablet (0.6 mg total) by mouth daily as needed. 30 tablet 1   fish oil-omega-3 fatty acids 1000 MG capsule Take 1 g by mouth daily.     metFORMIN (GLUCOPHAGE-XR) 500 MG 24 hr tablet Take 1 tablet by mouth once daily with breakfast 90 tablet 1   metoprolol tartrate (LOPRESSOR) 25 MG tablet Take 1 tablet (25 mg total) by mouth 2 (two) times daily. 180 tablet 1   nitroGLYCERIN (NITROSTAT) 0.4 MG SL tablet Place 1 tablet (0.4 mg total) under the tongue every 5 (five) minutes as needed for chest pain. 12 tablet 1   NON FORMULARY Glucometer, strips, and lancets to check dailly     rosuvastatin (CRESTOR) 40 MG tablet Take 1 tablet (  40 mg total) by mouth at bedtime. 90 tablet 1   tadalafil (CIALIS) 20 MG tablet Take 20 mg by mouth daily as needed for erectile dysfunction.     TRULICITY 0.75 MG/0.5ML SOPN INJECT 0.75MG  SUBCUTANEOUSLY ONCE A WEEK 4 mL 0   amLODipine (NORVASC) 10 MG tablet Take 1 tablet (10 mg total) by mouth daily. 90 tablet 1   olmesartan-hydrochlorothiazide (BENICAR HCT) 40-25 MG tablet Take 1 tablet by mouth daily. 90 tablet 1   No facility-administered medications prior to visit.    No Known Allergies  ROS Review of Systems    Objective:    Physical Exam Constitutional:      Appearance: Normal appearance. He is well-developed.  HENT:     Head: Normocephalic and atraumatic.  Cardiovascular:     Rate and Rhythm: Normal rate and regular rhythm.      Heart sounds: Normal heart sounds.  Pulmonary:     Effort: Pulmonary effort is normal.     Breath sounds: Normal breath sounds.  Skin:    General: Skin is warm and dry.  Neurological:     Mental Status: He is alert and oriented to person, place, and time. Mental status is at baseline.  Psychiatric:        Behavior: Behavior normal.    BP 115/65   Pulse 82   Ht 5\' 8"  (1.727 m)   Wt 211 lb (95.7 kg)   SpO2 99%   BMI 32.08 kg/m  Wt Readings from Last 3 Encounters:  05/16/21 211 lb (95.7 kg)  10/25/20 209 lb (94.8 kg)  04/29/20 204 lb 13.9 oz (92.9 kg)     Health Maintenance Due  Topic Date Due   COVID-19 Vaccine (1) Never done   Zoster Vaccines- Shingrix (1 of 2) Never done   Pneumococcal Vaccine 42-22 Years old (2 - PCV) 07/05/2011   FOOT EXAM  04/29/2021    There are no preventive care reminders to display for this patient.  Lab Results  Component Value Date   TSH 1.046 09/30/2012   Lab Results  Component Value Date   WBC 6.4 01/30/2013   HGB 13.2 01/30/2013   HCT 39.1 01/30/2013   MCV 80.5 01/30/2013   PLT 278.0 01/30/2013   Lab Results  Component Value Date   NA 140 10/25/2020   K 4.4 10/25/2020   CO2 30 10/25/2020   GLUCOSE 110 (H) 10/25/2020   BUN 21 10/25/2020   CREATININE 1.10 10/25/2020   BILITOT 0.5 04/29/2020   ALKPHOS 67 05/07/2017   AST 16 04/29/2020   ALT 20 04/29/2020   PROT 7.3 04/29/2020   ALBUMIN 4.4 05/07/2017   CALCIUM 10.2 10/25/2020   GFR 103.40 01/30/2013   Lab Results  Component Value Date   CHOL 126 04/29/2020   Lab Results  Component Value Date   HDL 39 (L) 04/29/2020   Lab Results  Component Value Date   LDLCALC 71 04/29/2020   Lab Results  Component Value Date   TRIG 81 04/29/2020   Lab Results  Component Value Date   CHOLHDL 3.2 04/29/2020   Lab Results  Component Value Date   HGBA1C 6.4 (A) 05/16/2021      Assessment & Plan:   Problem List Items Addressed This Visit       Cardiovascular and  Mediastinum   HYPERTENSION, BENIGN    Blood pressure is actually a little bit low today so we discussed decreasing his amlodipine.  He also like to go back to the  [facility] so he can put all the medications back in 1 we did previously separated out because of cost.  But he like to see if the insurance will cover it again.  New prescription sent to pharmacy if he is otherwise doing well I will see him back in about 4 months.       Relevant Medications   Olmesartan-amLODIPine-HCTZ 40-5-25 MG TABS   Other Relevant Orders   CBC   COMPLETE METABOLIC PANEL WITH GFR   Lipid panel   PSA   CAD (coronary artery disease)    Able.  No recent chest pain or shortness of breath.       Relevant Medications   Olmesartan-amLODIPine-HCTZ 40-5-25 MG TABS     Endocrine   Controlled diabetes mellitus type 2 with complications (HCC)    A1c looks phenomenal today at 6.4 currently on low-dose Trulicity and metformin.  Continue work on [employer] choices regular exercise and weight loss he is really done great.       Relevant Medications   Olmesartan-amLODIPine-HCTZ 40-5-25 MG TABS     Other   Gout    No recent flares or exacerbations.       Relevant Orders   Uric acid   Other Visit Diagnoses     Diabetes mellitus without complication (HCC)    -  Primary   Relevant Medications   Olmesartan-amLODIPine-HCTZ 40-5-25 MG TABS   Other Relevant Orders   POCT glycosylated hemoglobin (Hb A1C) (Completed)   CBC   COMPLETE METABOLIC PANEL WITH GFR   Lipid panel   PSA   Screening for prostate cancer       Relevant Orders   PSA       Meds ordered this encounter  Medications   Olmesartan-amLODIPine-HCTZ 40-5-25 MG TABS    Sig: Take 1 tablet by mouth daily.    Dispense:  90 tablet    Refill:  1     Follow-up: Return in about 4 months (around 09/16/2021) for Hypertension, Diabetes follow-up.    Nani Gasser, MD

## 2021-05-16 NOTE — Assessment & Plan Note (Signed)
Able.  No recent chest pain or shortness of breath.

## 2021-05-16 NOTE — Assessment & Plan Note (Signed)
A1c looks phenomenal today at 6.4 currently on low-dose Trulicity and metformin.  Continue work on [employer] choices regular exercise and weight loss he is really done great.

## 2021-05-16 NOTE — Assessment & Plan Note (Signed)
Blood pressure is actually a little bit low today so we discussed decreasing his amlodipine.  He also like to go back to the [facility] so he can put all the medications back in 1 we did previously separated out because of cost.  But he like to see if the insurance will cover it again.  New prescription sent to pharmacy if he is otherwise doing well I will see him back in about 4 months.

## 2021-05-17 LAB — CBC
HCT: 39.8 % (ref 38.5–50.0)
Hemoglobin: 13 g/dL — ABNORMAL LOW (ref 13.2–17.1)
MCH: 27.3 pg (ref 27.0–33.0)
MCHC: 32.7 g/dL (ref 32.0–36.0)
MCV: 83.6 fL (ref 80.0–100.0)
MPV: 10.9 fL (ref 7.5–12.5)
Platelets: 275 10*3/uL (ref 140–400)
RBC: 4.76 10*6/uL (ref 4.20–5.80)
RDW: 14.6 % (ref 11.0–15.0)
WBC: 6 10*3/uL (ref 3.8–10.8)

## 2021-05-17 LAB — LIPID PANEL
Cholesterol: 147 mg/dL (ref <200)
HDL: 36 mg/dL — ABNORMAL LOW (ref >=40)
LDL Cholesterol (Calc): 87 mg/dL (calc)
Non-HDL Cholesterol (Calc): 111 mg/dL (calc) (ref <130)
Total CHOL/HDL Ratio: 4.1 (calc) (ref <5.0)
Triglycerides: 144 mg/dL (ref <150)

## 2021-05-17 LAB — COMPLETE METABOLIC PANEL WITH GFR
AG Ratio: 1.7 (calc) (ref 1.0–2.5)
ALT: 22 U/L (ref 9–46)
AST: 21 U/L (ref 10–35)
Albumin: 4.5 g/dL (ref 3.6–5.1)
Alkaline phosphatase (APISO): 68 U/L (ref 35–144)
BUN: 24 mg/dL (ref 7–25)
CO2: 25 mmol/L (ref 20–32)
Calcium: 9.6 mg/dL (ref 8.6–10.3)
Chloride: 107 mmol/L (ref 98–110)
Creat: 1.04 mg/dL (ref 0.70–1.35)
Globulin: 2.6 g/dL (calc) (ref 1.9–3.7)
Glucose, Bld: 105 mg/dL — ABNORMAL HIGH (ref 65–99)
Potassium: 4.2 mmol/L (ref 3.5–5.3)
Sodium: 140 mmol/L (ref 135–146)
Total Bilirubin: 0.3 mg/dL (ref 0.2–1.2)
Total Protein: 7.1 g/dL (ref 6.1–8.1)
eGFR: 81 mL/min/{1.73_m2} (ref >=60)

## 2021-05-17 LAB — URIC ACID: Uric Acid, Serum: 7.2 mg/dL (ref 4.0–8.0)

## 2021-05-17 LAB — PSA: PSA: 0.84 ng/mL (ref <=4.00)

## 2021-08-14 ENCOUNTER — Other Ambulatory Visit: Payer: Self-pay | Admitting: Family Medicine

## 2021-09-09 ENCOUNTER — Other Ambulatory Visit: Payer: Self-pay | Admitting: Family Medicine

## 2021-09-19 ENCOUNTER — Encounter: Payer: Self-pay | Admitting: Family Medicine

## 2021-09-19 ENCOUNTER — Other Ambulatory Visit: Payer: Self-pay

## 2021-09-19 ENCOUNTER — Ambulatory Visit (INDEPENDENT_AMBULATORY_CARE_PROVIDER_SITE_OTHER): Payer: BC Managed Care – PPO | Admitting: Family Medicine

## 2021-09-19 VITALS — BP 124/58 | HR 89 | Ht 68.0 in | Wt 209.0 lb

## 2021-09-19 DIAGNOSIS — E119 Type 2 diabetes mellitus without complications: Secondary | ICD-10-CM | POA: Diagnosis not present

## 2021-09-19 DIAGNOSIS — R011 Cardiac murmur, unspecified: Secondary | ICD-10-CM

## 2021-09-19 DIAGNOSIS — E118 Type 2 diabetes mellitus with unspecified complications: Secondary | ICD-10-CM

## 2021-09-19 DIAGNOSIS — Z23 Encounter for immunization: Secondary | ICD-10-CM

## 2021-09-19 DIAGNOSIS — D649 Anemia, unspecified: Secondary | ICD-10-CM

## 2021-09-19 DIAGNOSIS — I10 Essential (primary) hypertension: Secondary | ICD-10-CM | POA: Diagnosis not present

## 2021-09-19 DIAGNOSIS — E7800 Pure hypercholesterolemia, unspecified: Secondary | ICD-10-CM

## 2021-09-19 DIAGNOSIS — M1A09X0 Idiopathic chronic gout, multiple sites, without tophus (tophi): Secondary | ICD-10-CM

## 2021-09-19 LAB — POCT GLYCOSYLATED HEMOGLOBIN (HGB A1C): Hemoglobin A1C: 6.1 % — AB (ref 4.0–5.6)

## 2021-09-19 MED ORDER — ROSUVASTATIN CALCIUM 40 MG PO TABS: 40.0000 mg | ORAL_TABLET | Freq: Every day | ORAL | 3 refills | Status: DC

## 2021-09-19 MED ORDER — OLMESARTAN-AMLODIPINE-HCTZ 40-5-25 MG PO TABS: 1.0000 | ORAL_TABLET | Freq: Every day | ORAL | 1 refills | Status: DC

## 2021-09-19 MED ORDER — METFORMIN HCL ER 500 MG PO TB24: 500.0000 mg | ORAL_TABLET | Freq: Every day | ORAL | 1 refills | Status: DC

## 2021-09-19 NOTE — Progress Notes (Signed)
Established Patient Office Visit  Subjective:  Patient ID: Steven Estrada, male    DOB: January 05, 1956  Age: 65 y.o. MRN: [id number]  CC:  Chief Complaint  Patient presents with   Diabetes    HPI Steven Estrada presents for   Hypertension- Pt denies chest pain, SOB, dizziness, or heart palpitations.  Taking meds as directed w/o problems.  Denies medication side effects.    Diabetes - no hypoglycemic events. No wounds or sores that are not healing well. No increased thirst or urination. Checking glucose at home. Taking medications as prescribed without any side effects.  He is doing really well with his Trulicity.  He recently had labs done in July.  Hemoglobin was borderline low at 13.0.  He denies seeing any blood in his urine or stool.  His colonoscopy is up-to-date he just had that done in 2021.  Is just been a year.  He does have a history of CAD and follows with cardiology yearly.  Last stress test was in 2020.  He is currently on Plavix and Crestor.  Past Medical History:  Diagnosis Date   Diabetes mellitus without complication ([place])    Hx of cardiovascular stress test    a. ETT-Myoview 3/14:  EF 56%, + inferior ischemia   Hyperlipidemia    Hypertension     Past Surgical History:  Procedure Laterality Date   CORONARY ANGIOPLASTY WITH STENT PLACEMENT  04/2013   Dr. Elonda Husky at [place] N/A 01/31/2013   Procedure: LEFT HEART CATHETERIZATION WITH CORONARY ANGIOGRAM;  Surgeon: Peter M Martinique, MD;  Location: [facility] CATH LAB;  Service: Cardiovascular;  Laterality: N/A;    Family History  Problem Relation Age of Onset   Heart failure Mother 109   Hypertension Sister    Hypertension Brother    Hypertension Sister     Social History   Socioeconomic History   Marital status: Divorced    Spouse name: Not on file   Number of children: 1   Years of education: Not on file   Highest education level: Not on file  Occupational  History   Occupation: TRUCK DRIVER    Employer: [employer]  Tobacco Use   Smoking status: Never   Smokeless tobacco: Never  Vaping Use   Vaping Use: Never used  Substance and Sexual Activity   Alcohol use: Yes    Comment: 12 per month   Drug use: No   Sexual activity: Not on file  Other Topics Concern   Not on file  Social History Narrative   Works out Biochemist, clinical.    Social Determinants of Health   Financial Resource Strain: Not on file  Food Insecurity: Not on file  Transportation Needs: Not on file  Physical Activity: Not on file  Stress: Not on file  Social Connections: Not on file  Intimate Partner Violence: Not on file    Outpatient Medications Prior to Visit  Medication Sig Dispense Refill   allopurinol (ZYLOPRIM) 300 MG tablet TAKE ONE & ONE-HALF TABLETS BY MOUTH ONCE DAILY 135 tablet 1   aspirin EC 81 MG tablet Take 81 mg by mouth daily. Swallow whole.     clopidogrel (PLAVIX) 75 MG tablet      colchicine 0.6 MG tablet Take 1 tablet (0.6 mg total) by mouth daily as needed. 30 tablet 1   fish oil-omega-3 fatty acids 1000 MG capsule Take 1 g by mouth daily.     metoprolol tartrate (LOPRESSOR) 25  MG tablet Take 1 tablet by mouth twice daily 180 tablet 0   nitroGLYCERIN (NITROSTAT) 0.4 MG SL tablet Place 1 tablet (0.4 mg total) under the tongue every 5 (five) minutes as needed for chest pain. 12 tablet 1   NON FORMULARY Glucometer, strips, and lancets to check dailly     tadalafil (CIALIS) 20 MG tablet Take 20 mg by mouth daily as needed for erectile dysfunction.     TRULICITY 4.08 XK/4.8JE SOPN INJECT 0.75 MG SUBCUTANEOUSLY ONCE A WEEK 4 mL 0   metFORMIN (GLUCOPHAGE-XR) 500 MG 24 hr tablet Take 1 tablet by mouth once daily with breakfast 90 tablet 1   Olmesartan-amLODIPine-HCTZ 40-5-25 MG TABS Take 1 tablet by mouth daily. 90 tablet 1   rosuvastatin (CRESTOR) 40 MG tablet Take 1 tablet (40 mg total) by mouth at bedtime. 90 tablet 1   No facility-administered medications  prior to visit.    No Known Allergies  ROS Review of Systems    Objective:    Physical Exam Constitutional:      Appearance: Normal appearance. He is well-developed.  HENT:     Head: Normocephalic and atraumatic.  Cardiovascular:     Rate and Rhythm: Normal rate and regular rhythm.     Heart sounds: Murmur heard.     Comments: 2 /6 systolic ejection murmur best heard at the left sternal border. Pulmonary:     Effort: Pulmonary effort is normal.     Breath sounds: Normal breath sounds.  Skin:    General: Skin is warm and dry.  Neurological:     Mental Status: He is alert and oriented to person, place, and time. Mental status is at baseline.  Psychiatric:        Behavior: Behavior normal.    BP (!) 124/58   Pulse 89   Ht _0  (1.727 m)   Wt 209 lb (94.8 kg)   SpO2 98%   BMI 31.78 kg/m  Wt Readings from Last 3 Encounters:  09/19/21 209 lb (94.8 kg)  05/16/21 211 lb (95.7 kg)  10/25/20 209 lb (94.8 kg)     There are no preventive care reminders to display for this patient.  There are no preventive care reminders to display for this patient.  Lab Results  Component Value Date   TSH 1.046 09/30/2012   Lab Results  Component Value Date   WBC 6.0 05/16/2021   HGB 13.0 (L) 05/16/2021   HCT 39.8 05/16/2021   MCV 83.6 05/16/2021   PLT 275 05/16/2021   Lab Results  Component Value Date   NA 140 05/16/2021   K 4.2 05/16/2021   CO2 25 05/16/2021   GLUCOSE 105 (H) 05/16/2021   BUN 24 05/16/2021   CREATININE 1.04 05/16/2021   BILITOT 0.3 05/16/2021   ALKPHOS 67 05/07/2017   AST 21 05/16/2021   ALT 22 05/16/2021   PROT 7.1 05/16/2021   ALBUMIN 4.4 05/07/2017   CALCIUM 9.6 05/16/2021   EGFR 81 05/16/2021   GFR 103.40 01/30/2013   Lab Results  Component Value Date   CHOL 147 05/16/2021   Lab Results  Component Value Date   HDL 36 (L) 05/16/2021   Lab Results  Component Value Date   LDLCALC 87 05/16/2021   Lab Results  Component Value Date    TRIG 144 05/16/2021   Lab Results  Component Value Date   CHOLHDL 4.1 05/16/2021   Lab Results  Component Value Date   HGBA1C 6.1 (A) 09/19/2021  Assessment & Plan:   Problem List Items Addressed This Visit       Cardiovascular and Mediastinum   HYPERTENSION, BENIGN    Well controlled. Continue current regimen. Follow up in  4 mo       Relevant Medications   aspirin EC 81 MG tablet   rosuvastatin (CRESTOR) 40 MG tablet   Olmesartan-amLODIPine-HCTZ 40-5-25 MG TABS   Other Relevant Orders   BASIC METABOLIC PANEL WITH GFR   CBC   Fe+TIBC+Fer   B12     Endocrine   Controlled diabetes mellitus type 2 with complications ([place])    Well controlled. Continue current regimen. Follow up in  4 mo       Relevant Medications   aspirin EC 81 MG tablet   rosuvastatin (CRESTOR) 40 MG tablet   Olmesartan-amLODIPine-HCTZ 40-5-25 MG TABS   metFORMIN (GLUCOPHAGE-XR) 500 MG 24 hr tablet     Other   Hyperlipidemia   Relevant Medications   aspirin EC 81 MG tablet   rosuvastatin (CRESTOR) 40 MG tablet   Olmesartan-amLODIPine-HCTZ 40-5-25 MG TABS   Heart murmur   Gout    Doing well.  No recent flares or exacerbations.  Last uric acid just slightly elevated.      Other Visit Diagnoses     Diabetes mellitus without complication ([place])    -  Primary   Relevant Medications   aspirin EC 81 MG tablet   rosuvastatin (CRESTOR) 40 MG tablet   Olmesartan-amLODIPine-HCTZ 40-5-25 MG TABS   metFORMIN (GLUCOPHAGE-XR) 500 MG 24 hr tablet   Other Relevant Orders   POCT glycosylated hemoglobin (Hb A1C) (Completed)   BASIC METABOLIC PANEL WITH GFR   CBC   Fe+TIBC+Fer   B12   Need for immunization against influenza       Relevant Orders   Flu Vaccine QUAD 8moIM (Fluarix, Fluzone & Alfiuria Quad PF) (Completed)   Low hemoglobin       Relevant Orders   BASIC METABOLIC PANEL WITH GFR   CBC   Fe+TIBC+Fer   B12       Low hemoglobin-unclear etiology was a little borderline low I do  want a recheck it today since its been 4 months.  Also check for iron deficiency.  Interestingly his sister has B12 deficiency and gets regular injections.  So we will check him for B12 deficiency as well.  He denies any blood in the urine or stool or loss of blood.  Please remember to schedule follow-up in December with your cardiologist for your yearly checkup.  Meds ordered this encounter  Medications   rosuvastatin (CRESTOR) 40 MG tablet    Sig: Take 1 tablet (40 mg total) by mouth at bedtime.    Dispense:  90 tablet    Refill:  3   Olmesartan-amLODIPine-HCTZ 40-5-25 MG TABS    Sig: Take 1 tablet by mouth daily.    Dispense:  90 tablet    Refill:  1   metFORMIN (GLUCOPHAGE-XR) 500 MG 24 hr tablet    Sig: Take 1 tablet (500 mg total) by mouth daily with breakfast.    Dispense:  90 tablet    Refill:  1     Follow-up: Return in about 4 months (around 01/17/2022) for Diabetes follow-up, Hypertension.    CBeatrice Lecher MD

## 2021-09-19 NOTE — Assessment & Plan Note (Signed)
Well controlled. Continue current regimen. Follow up in  4 mo 

## 2021-09-19 NOTE — Assessment & Plan Note (Signed)
Doing well.  No recent flares or exacerbations.  Last uric acid just slightly elevated.

## 2021-09-20 LAB — CBC
HCT: 42.3 % (ref 38.5–50.0)
Hemoglobin: 13.7 g/dL (ref 13.2–17.1)
MCH: 27 pg (ref 27.0–33.0)
MCHC: 32.4 g/dL (ref 32.0–36.0)
MCV: 83.4 fL (ref 80.0–100.0)
MPV: 10.9 fL (ref 7.5–12.5)
Platelets: 297 10*3/uL (ref 140–400)
RBC: 5.07 10*6/uL (ref 4.20–5.80)
RDW: 14.8 % (ref 11.0–15.0)
WBC: 5.6 10*3/uL (ref 3.8–10.8)

## 2021-09-20 LAB — BASIC METABOLIC PANEL WITH GFR
BUN: 16 mg/dL (ref 7–25)
CO2: 29 mmol/L (ref 20–32)
Calcium: 10 mg/dL (ref 8.6–10.3)
Chloride: 103 mmol/L (ref 98–110)
Creat: 1.08 mg/dL (ref 0.70–1.35)
Glucose, Bld: 114 mg/dL — ABNORMAL HIGH (ref 65–99)
Potassium: 4.5 mmol/L (ref 3.5–5.3)
Sodium: 140 mmol/L (ref 135–146)
eGFR: 77 mL/min/{1.73_m2} (ref >=60)

## 2021-09-20 LAB — IRON,TIBC AND FERRITIN PANEL
%SAT: 26 % (calc) (ref 20–48)
Ferritin: 29 ng/mL (ref 24–380)
Iron: 91 ug/dL (ref 50–180)
TIBC: 350 mcg/dL (calc) (ref 250–425)

## 2021-09-20 LAB — VITAMIN B12: Vitamin B-12: 833 pg/mL (ref 200–1100)

## 2021-09-20 NOTE — Progress Notes (Signed)
Call patient: Blood count metabolic panel look good.  Iron levels are a little bit on the low end of normal so would encourage him to work on eating an iron rich diet, and even consider taking a multivitamin that has iron in it.  His B12 was normal.  If he starts noticing any blood in the urine or stool then please let us know.  Last colonoscopy was last year.

## 2021-10-07 ENCOUNTER — Other Ambulatory Visit: Payer: Self-pay | Admitting: Family Medicine

## 2021-10-10 ENCOUNTER — Telehealth: Payer: Self-pay | Admitting: Family Medicine

## 2021-10-10 NOTE — Telephone Encounter (Signed)
Patient dropped off a form to be filled out by PCP. Attached a billing form and placed in provider box. AM *10/10/2021

## 2021-10-17 NOTE — Telephone Encounter (Signed)
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

## 2021-10-18 DIAGNOSIS — R011 Cardiac murmur, unspecified: Secondary | ICD-10-CM | POA: Diagnosis not present

## 2021-10-18 DIAGNOSIS — I10 Essential (primary) hypertension: Secondary | ICD-10-CM | POA: Diagnosis not present

## 2021-10-18 DIAGNOSIS — I2510 Atherosclerotic heart disease of native coronary artery without angina pectoris: Secondary | ICD-10-CM | POA: Diagnosis not present

## 2021-10-18 DIAGNOSIS — E119 Type 2 diabetes mellitus without complications: Secondary | ICD-10-CM | POA: Diagnosis not present

## 2021-10-20 DIAGNOSIS — R011 Cardiac murmur, unspecified: Secondary | ICD-10-CM | POA: Diagnosis not present

## 2021-10-20 DIAGNOSIS — I2510 Atherosclerotic heart disease of native coronary artery without angina pectoris: Secondary | ICD-10-CM | POA: Diagnosis not present

## 2021-10-21 DIAGNOSIS — I2510 Atherosclerotic heart disease of native coronary artery without angina pectoris: Secondary | ICD-10-CM | POA: Diagnosis not present

## 2021-11-12 ENCOUNTER — Other Ambulatory Visit: Payer: Self-pay | Admitting: Family Medicine

## 2021-11-12 DIAGNOSIS — I10 Essential (primary) hypertension: Secondary | ICD-10-CM

## 2022-01-23 ENCOUNTER — Other Ambulatory Visit: Payer: Self-pay

## 2022-01-23 ENCOUNTER — Ambulatory Visit (INDEPENDENT_AMBULATORY_CARE_PROVIDER_SITE_OTHER): Payer: BC Managed Care – PPO | Admitting: Family Medicine

## 2022-01-23 ENCOUNTER — Encounter: Payer: Self-pay | Admitting: Family Medicine

## 2022-01-23 VITALS — BP 145/92 | HR 99 | Resp 16 | Ht 68.0 in | Wt 213.0 lb

## 2022-01-23 DIAGNOSIS — I10 Essential (primary) hypertension: Secondary | ICD-10-CM | POA: Diagnosis not present

## 2022-01-23 DIAGNOSIS — E119 Type 2 diabetes mellitus without complications: Secondary | ICD-10-CM | POA: Diagnosis not present

## 2022-01-23 DIAGNOSIS — I2510 Atherosclerotic heart disease of native coronary artery without angina pectoris: Secondary | ICD-10-CM

## 2022-01-23 DIAGNOSIS — E118 Type 2 diabetes mellitus with unspecified complications: Secondary | ICD-10-CM | POA: Diagnosis not present

## 2022-01-23 DIAGNOSIS — Z23 Encounter for immunization: Secondary | ICD-10-CM

## 2022-01-23 DIAGNOSIS — R790 Abnormal level of blood mineral: Secondary | ICD-10-CM | POA: Diagnosis not present

## 2022-01-23 DIAGNOSIS — M1A09X0 Idiopathic chronic gout, multiple sites, without tophus (tophi): Secondary | ICD-10-CM

## 2022-01-23 DIAGNOSIS — E7800 Pure hypercholesterolemia, unspecified: Secondary | ICD-10-CM

## 2022-01-23 LAB — POCT GLYCOSYLATED HEMOGLOBIN (HGB A1C): Hemoglobin A1C: 6.3 % — AB (ref 4.0–5.6)

## 2022-01-23 MED ORDER — METFORMIN HCL ER 500 MG PO TB24: 500.0000 mg | ORAL_TABLET | Freq: Every day | ORAL | 0 refills | Status: DC

## 2022-01-23 MED ORDER — TRULICITY 3 MG/0.5ML ~~LOC~~ SOAJ: 3.0000 mg | SUBCUTANEOUS | 0 refills | Status: DC

## 2022-01-23 MED ORDER — OLMESARTAN-AMLODIPINE-HCTZ 40-5-25 MG PO TABS: 1.0000 | ORAL_TABLET | Freq: Every day | ORAL | 3 refills | Status: DC

## 2022-01-23 MED ORDER — ROSUVASTATIN CALCIUM 40 MG PO TABS: 40.0000 mg | ORAL_TABLET | Freq: Every day | ORAL | 3 refills | Status: DC

## 2022-01-23 MED ORDER — ALLOPURINOL 300 MG PO TABS: ORAL_TABLET | ORAL | 3 refills | Status: DC

## 2022-01-23 NOTE — Assessment & Plan Note (Signed)
1C looks good at 6.3.  We will bump up Trulicity to 1.5 mg.  New prescription sent to pharmacy encouraged to continue to work on portion control.  My hope is that if we can adjust his Trulicity further we may be able to taper off the metformin. ?

## 2022-01-23 NOTE — Assessment & Plan Note (Addendum)
Pressure up a little bit here in the office today but he reports he gets 120s and 130s at home.  He has been holding his metoprolol because last time he was here in November he was not taking it and his systolic pressure was in the 120s.please restart Metoprolol.   ?

## 2022-01-23 NOTE — Assessment & Plan Note (Signed)
Stable.  No recent flares or exacerbations. 

## 2022-01-23 NOTE — Progress Notes (Signed)
? ?Established Patient Office Visit ? ?Subjective:  ?Patient ID: Steven Estrada, male    DOB: 11/18/1955  Age: 65 y.o. MRN: [id number] ? ?CC:  ?Chief Complaint  ?Patient presents with  ? Diabetes  ?  Follow up   ? Hypertension  ?  Follow up   ? Diabetes Eye Exam  ?  [facility] [place]. Release form faxed   ? ? ?HPI ?Steven Estrada presents for  ? ?Diabetes - no hypoglycemic events. No wounds or sores that are not healing well. No increased thirst or urination. Checking glucose at home. Taking medications as prescribed without any side effects. ? ?Hypertension- Pt denies chest pain, SOB, dizziness, or heart palpitations.  Taking meds as directed w/o problems.  Denies medication side effects.   ? ?Gout -He reports he is actually doing really well.  No recent flares.  He does need a refill on his allopurinol he takes 1-1/2 tabs of the 300 mg.  New prescription sent to pharmacy. ? ? ?Past Medical History:  ?Diagnosis Date  ? Diabetes mellitus without complication (HCC)   ? Hx of cardiovascular stress test   ? a. ETT-Myoview 3/14:  EF 56%, + inferior ischemia  ? Hyperlipidemia   ? Hypertension   ? ? ?Past Surgical History:  ?Procedure Laterality Date  ? CORONARY ANGIOPLASTY WITH STENT PLACEMENT  04/2013  ? Dr. Tyson at [facility]  ? LEFT HEART CATHETERIZATION WITH CORONARY ANGIOGRAM N/A 01/31/2013  ? Procedure: LEFT HEART CATHETERIZATION WITH CORONARY ANGIOGRAM;  Surgeon: Peter M Jordan, MD;  Location: [facility]ATH [facility];  Service: Cardiovascular;  Laterality: N/A;  ? ? ?Family History  ?Problem Relation Age of Onset  ? Heart failure Mother 80  ? Hypertension Sister   ? Hypertension Brother   ? Hypertension Sister   ? ? ?Social History  ? ?Socioeconomic History  ? Marital status: Divorced  ?  Spouse name: Not on file  ? Number of children: 1  ? Years of education: Not on file  ? Highest education level: Not on file  ?Occupational History  ? Occupation: TRUCK DRIVER  ?  Employer: [employer]  ?Tobacco Use  ?  Smoking status: Never  ? Smokeless tobacco: Never  ?Vaping Use  ? Vaping Use: Never used  ?Substance and Sexual Activity  ? Alcohol use: Yes  ?  Comment: 12 per month  ? Drug use: No  ? Sexual activity: Not on file  ?Other Topics Concern  ? Not on file  ?Social History Narrative  ? Works out rgulraly.   ? ?Social Determinants of Health  ? ?Financial Resource Strain: Not on file  ?Food Insecurity: Not on file  ?Transportation Needs: Not on file  ?Physical Activity: Not on file  ?Stress: Not on file  ?Social Connections: Not on file  ?Intimate Partner Violence: Not on file  ? ? ?Outpatient Medications Prior to Visit  ?Medication Sig Dispense Refill  ? aspirin EC 81 MG tablet Take 81 mg by mouth daily. Swallow whole.    ? clopidogrel (PLAVIX) 75 MG tablet     ? colchicine 0.6 MG tablet Take 1 tablet (0.6 mg total) by mouth daily as needed. 30 tablet 1  ? fish oil-omega-3 fatty acids 1000 MG capsule Take 1 g by mouth daily.    ? metoprolol tartrate (LOPRESSOR) 25 MG tablet Take 1 tablet by mouth twice daily 180 tablet 0  ? nitroGLYCERIN (NITROSTAT) 0.4 MG SL tablet Place 1 tablet (0.4 mg total) under the   tongue every 5 (five) minutes as needed for chest pain. 12 tablet 1  ? NON FORMULARY Glucometer, strips, and lancets to check dailly    ? tadalafil (CIALIS) 20 MG tablet Take 20 mg by mouth daily as needed for erectile dysfunction.    ? allopurinol (ZYLOPRIM) 300 MG tablet TAKE ONE & ONE-HALF TABLETS BY MOUTH ONCE DAILY 135 tablet 1  ? metFORMIN (GLUCOPHAGE-XR) 500 MG 24 hr tablet Take 1 tablet (500 mg total) by mouth daily with breakfast. 90 tablet 1  ? Olmesartan-amLODIPine-HCTZ 40-5-25 MG TABS Take 1 tablet by mouth daily. 90 tablet 1  ? rosuvastatin (CRESTOR) 40 MG tablet Take 1 tablet (40 mg total) by mouth at bedtime. 90 tablet 3  ? TRULICITY 1.76 HY/0.7PX SOPN INJECT 0.75MG SUBCUTANEOUSLY ONCE A WEEK 4 mL 3  ? ?No facility-administered medications prior to visit.  ? ? ?Allergies  ?Allergen Reactions  ? Metoprolol  Other (See Comments)  ?  Dizzy   ? ? ?ROS ?Review of Systems ? ?  ?Objective:  ?  ?Physical Exam ?Constitutional:   ?   Appearance: Normal appearance. He is well-developed.  ?HENT:  ?   Head: Normocephalic and atraumatic.  ?Cardiovascular:  ?   Rate and Rhythm: Normal rate and regular rhythm.  ?   Heart sounds: Normal heart sounds.  ?   Comments: 2/6 SEM ?Pulmonary:  ?   Effort: Pulmonary effort is normal.  ?   Breath sounds: Normal breath sounds.  ?Skin: ?   General: Skin is warm and dry.  ?Neurological:  ?   Mental Status: He is alert and oriented to person, place, and time. Mental status is at baseline.  ?Psychiatric:     ?   Behavior: Behavior normal.  ? ? ?BP (!) 145/92 (BP Location: Right Arm)   Pulse 99   Resp 16   Ht 5' 8" (1.727 m)   Wt 213 lb (96.6 kg)   SpO2 97%   BMI 32.39 kg/m?  ?Wt Readings from Last 3 Encounters:  ?01/23/22 213 lb (96.6 kg)  ?09/19/21 209 lb (94.8 kg)  ?05/16/21 211 lb (95.7 kg)  ? ? ? ?Health Maintenance Due  ?Topic Date Due  ? OPHTHALMOLOGY EXAM  09/25/2019  ? ? ?There are no preventive care reminders to display for this patient. ? ?Lab Results  ?Component Value Date  ? TSH 1.046 09/30/2012  ? ?Lab Results  ?Component Value Date  ? WBC 5.6 09/19/2021  ? HGB 13.7 09/19/2021  ? HCT 42.3 09/19/2021  ? MCV 83.4 09/19/2021  ? PLT 297 09/19/2021  ? ?Lab Results  ?Component Value Date  ? NA 140 09/19/2021  ? K 4.5 09/19/2021  ? CO2 29 09/19/2021  ? GLUCOSE 114 (H) 09/19/2021  ? BUN 16 09/19/2021  ? CREATININE 1.08 09/19/2021  ? BILITOT 0.3 05/16/2021  ? ALKPHOS 67 05/07/2017  ? AST 21 05/16/2021  ? ALT 22 05/16/2021  ? PROT 7.1 05/16/2021  ? ALBUMIN 4.4 05/07/2017  ? CALCIUM 10.0 09/19/2021  ? EGFR 77 09/19/2021  ? GFR 103.40 01/30/2013  ? ?Lab Results  ?Component Value Date  ? CHOL 147 05/16/2021  ? ?Lab Results  ?Component Value Date  ? HDL 36 (L) 05/16/2021  ? ?Lab Results  ?Component Value Date  ? [place] 87 05/16/2021  ? ?Lab Results  ?Component Value Date  ? TRIG 144 05/16/2021   ? ?Lab Results  ?Component Value Date  ? CHOLHDL 4.1 05/16/2021  ? ?Lab Results  ?Component Value Date  ?  HGBA1C 6.3 (A) 01/23/2022  ? ? ?  ?Assessment & Plan:  ? ?Problem List Items Addressed This Visit   ? ?  ? Cardiovascular and Mediastinum  ? HYPERTENSION, BENIGN  ?  Pressure up a little bit here in the office today but he reports he gets 120s and 130s at home.  He has been holding his metoprolol because last time he was here in November he was not taking it and his systolic pressure was in the 120s.please restart Metoprolol.   ?  ?  ? Relevant Medications  ? Olmesartan-amLODIPine-HCTZ 40-5-25 MG TABS  ? rosuvastatin (CRESTOR) 40 MG tablet  ? Other Relevant Orders  ? BASIC METABOLIC PANEL WITH GFR  ? CAD (coronary artery disease)  ?  No recent chest pain or shortness of breath. ?  ?  ? Relevant Medications  ? Olmesartan-amLODIPine-HCTZ 40-5-25 MG TABS  ? rosuvastatin (CRESTOR) 40 MG tablet  ?  ? Endocrine  ? Controlled diabetes mellitus type 2 with complications ([place])  ?  1C looks good at 6.3.  We will bump up Trulicity to 1.5 mg.  New prescription sent to pharmacy encouraged to continue to work on portion control.  My hope is that if we can adjust his Trulicity further we may be able to taper off the metformin. ?  ?  ? Relevant Medications  ? metFORMIN (GLUCOPHAGE-XR) 500 MG 24 hr tablet  ? Olmesartan-amLODIPine-HCTZ 40-5-25 MG TABS  ? rosuvastatin (CRESTOR) 40 MG tablet  ? Dulaglutide (TRULICITY) 3 EP/3.2RJ SOPN  ? Other Relevant Orders  ? BASIC METABOLIC PANEL WITH GFR  ?  ? Other  ? Hyperlipidemia  ? Relevant Medications  ? Olmesartan-amLODIPine-HCTZ 40-5-25 MG TABS  ? rosuvastatin (CRESTOR) 40 MG tablet  ? Gout  ?  Stable.  No recent flares or exacerbations. ?  ?  ? Relevant Medications  ? allopurinol (ZYLOPRIM) 300 MG tablet  ? ?Other Visit Diagnoses   ? ? Diabetes mellitus without complication ([place])    -  Primary  ? Relevant Medications  ? metFORMIN (GLUCOPHAGE-XR) 500 MG 24 hr tablet  ?  Olmesartan-amLODIPine-HCTZ 40-5-25 MG TABS  ? rosuvastatin (CRESTOR) 40 MG tablet  ? Dulaglutide (TRULICITY) 3 JO/8.4ZY SOPN  ? Other Relevant Orders  ? POCT glycosylated hemoglobin (Hb A1C) (Completed)  ? Low ferritin

## 2022-01-23 NOTE — Assessment & Plan Note (Signed)
No recent chest pain or shortness of breath. 

## 2022-01-24 LAB — BASIC METABOLIC PANEL WITH GFR
BUN: 13 mg/dL (ref 7–25)
CO2: 30 mmol/L (ref 20–32)
Calcium: 10.1 mg/dL (ref 8.6–10.3)
Chloride: 106 mmol/L (ref 98–110)
Creat: 1.07 mg/dL (ref 0.70–1.35)
Glucose, Bld: 132 mg/dL — ABNORMAL HIGH (ref 65–99)
Potassium: 4.6 mmol/L (ref 3.5–5.3)
Sodium: 142 mmol/L (ref 135–146)
eGFR: 78 mL/min/{1.73_m2} (ref >=60)

## 2022-01-24 LAB — IRON,TIBC AND FERRITIN PANEL
%SAT: 38 % (calc) (ref 20–48)
Ferritin: 40 ng/mL (ref 24–380)
Iron: 128 ug/dL (ref 50–180)
TIBC: 333 mcg/dL (calc) (ref 250–425)

## 2022-01-24 NOTE — Progress Notes (Signed)
Call patient: Complete metabolic panel overall looks good.  Iron panel looks better.  Ferritin was 29 it is up to 40 so I just want him to continue to take his iron for about 3 more months and then he can stop.

## 2022-05-01 ENCOUNTER — Ambulatory Visit (INDEPENDENT_AMBULATORY_CARE_PROVIDER_SITE_OTHER): Payer: BC Managed Care – PPO | Admitting: Family Medicine

## 2022-05-01 ENCOUNTER — Encounter: Payer: Self-pay | Admitting: Family Medicine

## 2022-05-01 VITALS — BP 123/62 | HR 89 | Resp 16 | Ht 68.0 in | Wt 208.0 lb

## 2022-05-01 DIAGNOSIS — I10 Essential (primary) hypertension: Secondary | ICD-10-CM

## 2022-05-01 DIAGNOSIS — E118 Type 2 diabetes mellitus with unspecified complications: Secondary | ICD-10-CM

## 2022-05-01 DIAGNOSIS — E119 Type 2 diabetes mellitus without complications: Secondary | ICD-10-CM | POA: Diagnosis not present

## 2022-05-01 DIAGNOSIS — I2510 Atherosclerotic heart disease of native coronary artery without angina pectoris: Secondary | ICD-10-CM

## 2022-05-01 DIAGNOSIS — M1A09X0 Idiopathic chronic gout, multiple sites, without tophus (tophi): Secondary | ICD-10-CM | POA: Diagnosis not present

## 2022-05-01 LAB — POCT GLYCOSYLATED HEMOGLOBIN (HGB A1C): Hemoglobin A1C: 6.1 % — AB (ref 4.0–5.6)

## 2022-05-01 LAB — POCT UA - MICROALBUMIN
Albumin/Creatinine Ratio, Urine, POC: <30
Creatinine, POC: 200 mg/dL
Microalbumin Ur, POC: 30 mg/L

## 2022-05-01 MED ORDER — METOPROLOL TARTRATE 25 MG PO TABS: 25.0000 mg | ORAL_TABLET | Freq: Two times a day (BID) | ORAL | 3 refills | Status: DC

## 2022-05-01 MED ORDER — TADALAFIL 20 MG PO TABS: 20.0000 mg | ORAL_TABLET | Freq: Every day | ORAL | 99 refills | Status: DC | PRN

## 2022-05-01 MED ORDER — TRULICITY 3 MG/0.5ML ~~LOC~~ SOAJ: 3.0000 mg | SUBCUTANEOUS | 1 refills | Status: DC

## 2022-05-01 MED ORDER — COLCHICINE 0.6 MG PO TABS: 0.6000 mg | ORAL_TABLET | Freq: Every day | ORAL | 1 refills | Status: DC | PRN

## 2022-05-01 MED ORDER — ALLOPURINOL 300 MG PO TABS: ORAL_TABLET | ORAL | 3 refills | Status: AC

## 2022-05-01 MED ORDER — OLMESARTAN-AMLODIPINE-HCTZ 40-5-25 MG PO TABS: 1.0000 | ORAL_TABLET | Freq: Every day | ORAL | 3 refills | Status: DC

## 2022-05-01 NOTE — Progress Notes (Signed)
Established Patient Office Visit  Subjective   Patient ID: Steven Estrada, male    DOB: January 11, 1956  Age: 66 y.o. MRN: [id number]  Chief Complaint  Patient presents with   Diabetes    Follow up. DM eye exam done at [facility] in [place]. Release form faxed.    Hypertension    Follow up     HPI  Diabetes - no hypoglycemic events. No wounds or sores that are not healing well. No increased thirst or urination. Checking glucose at home. Taking medications as prescribed without any side effects.  Currently on Trulicity 3 mg.  He is actually lost about 5 pounds since he was last here in March. Off of metformin.  Reports his most recent eye exam is up-to-date and he had it done at my eye doctor on [address].  He would like to consider switching to Ozempic for improved weight reduction benefit.  Hypertension- Pt denies chest pain, SOB, dizziness, or heart palpitations.  Taking meds as directed w/o problems.  Denies medication side effects.    He reports he is doing really well overall.  No recent chest pain or shortness of breath.  Gout has actually been doing well he says he really has not had any major flares since he is really been working on improving his diet.  He was able to cut the grass to be pretty physically active yesterday and says he actually felt good.    ROS    Objective:     BP 123/62   Pulse 89   Resp 16   Ht 5\' 8"  (1.727 m)   Wt 208 lb (94.3 kg)   SpO2 97%   BMI 31.63 kg/m    Physical Exam   Results for orders placed or performed in visit on 05/01/22  POCT glycosylated hemoglobin (Hb A1C)  Result Value Ref Range   Hemoglobin A1C 6.1 (A) 4.0 - 5.6 %   HbA1c POC (<> result, manual entry)     HbA1c, POC (prediabetic range)     HbA1c, POC (controlled diabetic range)    POCT UA - Microalbumin  Result Value Ref Range   Microalbumin Ur, POC 30 mg/L   Creatinine, POC 200 mg/dL   Albumin/Creatinine Ratio, Urine, POC <30       The 10-year ASCVD  risk score (Arnett DK, et al., 2019) is: 26%    Assessment & Plan:   Problem List Items Addressed This Visit       Cardiovascular and Mediastinum   HYPERTENSION, BENIGN    Well controlled. Continue current regimen. Follow up in  4 mo       Relevant Medications   metoprolol tartrate (LOPRESSOR) 25 MG tablet   Olmesartan-amLODIPine-HCTZ 40-5-25 MG TABS   tadalafil (CIALIS) 20 MG tablet   CAD (coronary artery disease)    No recent chest pain or shortness of breath.      Relevant Medications   metoprolol tartrate (LOPRESSOR) 25 MG tablet   Olmesartan-amLODIPine-HCTZ 40-5-25 MG TABS   tadalafil (CIALIS) 20 MG tablet     Endocrine   Controlled diabetes mellitus type 2 with complications (HCC)    Well controlled. Continue current regimen. Follow up in  4 mo       Relevant Medications   Dulaglutide (TRULICITY) 3 MG/0.5ML SOPN   Olmesartan-amLODIPine-HCTZ 40-5-25 MG TABS   Other Relevant Orders   POCT UA - Microalbumin (Completed)     Other   Gout    No recent flares or  exacerbations.  He feels like since he is really improved his diet his gout has been better as well.      Relevant Medications   allopurinol (ZYLOPRIM) 300 MG tablet   colchicine 0.6 MG tablet   Other Visit Diagnoses     Diabetes mellitus without complication (HCC)    -  Primary   Relevant Medications   Dulaglutide (TRULICITY) 3 MG/0.5ML SOPN   Olmesartan-amLODIPine-HCTZ 40-5-25 MG TABS   Other Relevant Orders   POCT glycosylated hemoglobin (Hb A1C) (Completed)       Return in about 15 weeks (around 08/14/2022) for Diabetes follow-up.    Nani Gasser, MD

## 2022-05-01 NOTE — Assessment & Plan Note (Signed)
No recent flares or exacerbations.  He feels like since he is really improved his diet his gout has been better as well.

## 2022-05-10 ENCOUNTER — Encounter: Payer: Self-pay | Admitting: Family Medicine

## 2022-08-28 ENCOUNTER — Encounter: Payer: Self-pay | Admitting: Family Medicine

## 2022-08-28 ENCOUNTER — Ambulatory Visit (INDEPENDENT_AMBULATORY_CARE_PROVIDER_SITE_OTHER): Payer: BC Managed Care – PPO | Admitting: Family Medicine

## 2022-08-28 VITALS — BP 135/73 | HR 89 | Ht 68.0 in | Wt 207.0 lb

## 2022-08-28 DIAGNOSIS — I10 Essential (primary) hypertension: Secondary | ICD-10-CM

## 2022-08-28 DIAGNOSIS — Z125 Encounter for screening for malignant neoplasm of prostate: Secondary | ICD-10-CM

## 2022-08-28 DIAGNOSIS — E118 Type 2 diabetes mellitus with unspecified complications: Secondary | ICD-10-CM | POA: Diagnosis not present

## 2022-08-28 DIAGNOSIS — E119 Type 2 diabetes mellitus without complications: Secondary | ICD-10-CM

## 2022-08-28 DIAGNOSIS — Z23 Encounter for immunization: Secondary | ICD-10-CM

## 2022-08-28 LAB — POCT GLYCOSYLATED HEMOGLOBIN (HGB A1C): Hemoglobin A1C: 6.2 % — AB (ref 4.0–5.6)

## 2022-08-28 NOTE — Assessment & Plan Note (Signed)
A1c looks great today at 6.2.  We discussed going up in the Trulicity to 4.5 mg he just filled a 90 days and when he gets close to running out we will go ahead and bump it up to 4.5.  I will see him back in 4 months that way he can have at least a month on the increased dose when I see him back.  Continue to work on [employer] and regular exercise.

## 2022-08-28 NOTE — Assessment & Plan Note (Signed)
Well controlled. Continue current regimen. Follow up in  4 mo 

## 2022-08-28 NOTE — Progress Notes (Signed)
Established Patient Office Visit  Subjective   Patient ID: Steven Estrada, male    DOB: Nov 20, 1955  Age: 66 y.o. MRN: [id number]  Chief Complaint  Patient presents with   Diabetes   Hypertension    HPI  Diabetes - no hypoglycemic events. No wounds or sores that are not healing well. No increased thirst or urination. Checking glucose at home. Taking medications as prescribed without any side effects.  He walks occasionally for exercise.  Hypertension- Pt denies chest pain, SOB, dizziness, or heart palpitations.  Taking meds as directed w/o problems.  Denies medication side effects.        ROS    Objective:     BP 135/73   Pulse 89   Ht 5\' 8"  (1.727 m)   Wt 207 lb (93.9 kg)   SpO2 97%   BMI 31.47 kg/m    Physical Exam Constitutional:      Appearance: He is well-developed.  HENT:     Head: Normocephalic and atraumatic.  Cardiovascular:     Rate and Rhythm: Normal rate and regular rhythm.     Heart sounds: Normal heart sounds.  Pulmonary:     Effort: Pulmonary effort is normal.     Breath sounds: Normal breath sounds.  Skin:    General: Skin is warm and dry.  Neurological:     Mental Status: He is alert and oriented to person, place, and time.  Psychiatric:        Behavior: Behavior normal.      Results for orders placed or performed in visit on 08/28/22  POCT glycosylated hemoglobin (Hb A1C)  Result Value Ref Range   Hemoglobin A1C 6.2 (A) 4.0 - 5.6 %   HbA1c POC (<> result, manual entry)     HbA1c, POC (prediabetic range)     HbA1c, POC (controlled diabetic range)        The 10-year ASCVD risk score (Arnett DK, et al., 2019) is: 30.2%    Assessment & Plan:   Problem List Items Addressed This Visit       Cardiovascular and Mediastinum   HYPERTENSION, BENIGN    Well controlled. Continue current regimen. Follow up in 4 mo        Relevant Orders   PSA   Lipid panel   COMPLETE METABOLIC PANEL WITH GFR   CBC     Endocrine   Controlled  diabetes mellitus type 2 with complications (HCC)    A1c looks great today at 6.2.  We discussed going up in the Trulicity to 4.5 mg he just filled a 90 days and when he gets close to running out we will go ahead and bump it up to 4.5.  I will see him back in 4 months that way he can have at least a month on the increased dose when I see him back.  Continue to work on 2020 and regular exercise.      Other Visit Diagnoses     Diabetes mellitus without complication (HCC)    -  Primary   Relevant Orders   POCT glycosylated hemoglobin (Hb A1C) (Completed)   PSA   Lipid panel   COMPLETE METABOLIC PANEL WITH GFR   CBC   Need for immunization against influenza       Relevant Orders   Flu Vaccine QUAD 18mo+IM (Fluarix, Fluzone & Alfiuria Quad PF) (Completed)   Screening for prostate cancer       Relevant Orders   PSA  Lipid panel   COMPLETE METABOLIC PANEL WITH GFR   CBC       Return in about 4 months (around 12/29/2022) for Diabetes follow-up.    Beatrice Lecher, MD

## 2022-08-28 NOTE — Patient Instructions (Signed)
Let me know when you are getting low on the 3 mg Trulicity and I will send in a new prescription for 4.5 mg.

## 2022-08-29 LAB — COMPLETE METABOLIC PANEL WITH GFR
AG Ratio: 1.5 (calc) (ref 1.0–2.5)
ALT: 25 U/L (ref 9–46)
AST: 17 U/L (ref 10–35)
Albumin: 4.4 g/dL (ref 3.6–5.1)
Alkaline phosphatase (APISO): 73 U/L (ref 35–144)
BUN: 17 mg/dL (ref 7–25)
CO2: 29 mmol/L (ref 20–32)
Calcium: 9.9 mg/dL (ref 8.6–10.3)
Chloride: 102 mmol/L (ref 98–110)
Creat: 1.07 mg/dL (ref 0.70–1.35)
Globulin: 3 g/dL (calc) (ref 1.9–3.7)
Glucose, Bld: 116 mg/dL — ABNORMAL HIGH (ref 65–99)
Potassium: 4 mmol/L (ref 3.5–5.3)
Sodium: 143 mmol/L (ref 135–146)
Total Bilirubin: 0.5 mg/dL (ref 0.2–1.2)
Total Protein: 7.4 g/dL (ref 6.1–8.1)
eGFR: 77 mL/min/{1.73_m2} (ref >=60)

## 2022-08-29 LAB — CBC
HCT: 41.8 % (ref 38.5–50.0)
Hemoglobin: 13.8 g/dL (ref 13.2–17.1)
MCH: 28.2 pg (ref 27.0–33.0)
MCHC: 33 g/dL (ref 32.0–36.0)
MCV: 85.3 fL (ref 80.0–100.0)
MPV: 10.5 fL (ref 7.5–12.5)
Platelets: 294 10*3/uL (ref 140–400)
RBC: 4.9 10*6/uL (ref 4.20–5.80)
RDW: 14.9 % (ref 11.0–15.0)
WBC: 7 10*3/uL (ref 3.8–10.8)

## 2022-08-29 LAB — LIPID PANEL
Cholesterol: 265 mg/dL — ABNORMAL HIGH (ref <200)
HDL: 38 mg/dL — ABNORMAL LOW (ref >=40)
LDL Cholesterol (Calc): 180 mg/dL (calc) — ABNORMAL HIGH
Non-HDL Cholesterol (Calc): 227 mg/dL (calc) — ABNORMAL HIGH (ref <130)
Total CHOL/HDL Ratio: 7 (calc) — ABNORMAL HIGH (ref <5.0)
Triglycerides: 264 mg/dL — ABNORMAL HIGH (ref <150)

## 2022-08-29 LAB — PSA: PSA: 1.26 ng/mL (ref <=4.00)

## 2022-08-29 NOTE — Progress Notes (Signed)
Call pt: cholesterol is up. Make sure taking the Crestor nighty.  We will have to add a 2nd medication if we can't get the numbers down. All other labs look good.

## 2022-09-29 ENCOUNTER — Other Ambulatory Visit: Payer: Self-pay | Admitting: Family Medicine

## 2022-09-29 DIAGNOSIS — I10 Essential (primary) hypertension: Secondary | ICD-10-CM

## 2022-10-23 DIAGNOSIS — I10 Essential (primary) hypertension: Secondary | ICD-10-CM | POA: Diagnosis not present

## 2022-10-23 DIAGNOSIS — I2510 Atherosclerotic heart disease of native coronary artery without angina pectoris: Secondary | ICD-10-CM | POA: Diagnosis not present

## 2022-10-23 DIAGNOSIS — E7849 Other hyperlipidemia: Secondary | ICD-10-CM | POA: Diagnosis not present

## 2022-10-23 DIAGNOSIS — E119 Type 2 diabetes mellitus without complications: Secondary | ICD-10-CM | POA: Diagnosis not present

## 2022-11-08 ENCOUNTER — Other Ambulatory Visit: Payer: Self-pay | Admitting: Family Medicine

## 2022-11-08 DIAGNOSIS — E119 Type 2 diabetes mellitus without complications: Secondary | ICD-10-CM

## 2022-11-09 ENCOUNTER — Other Ambulatory Visit: Payer: Self-pay | Admitting: *Deleted

## 2022-11-09 DIAGNOSIS — E119 Type 2 diabetes mellitus without complications: Secondary | ICD-10-CM

## 2022-11-09 MED ORDER — TRULICITY 4.5 MG/0.5ML ~~LOC~~ SOAJ: 4.5000 mg | SUBCUTANEOUS | 1 refills | Status: DC

## 2022-11-09 MED ORDER — TRULICITY 4.5 MG/0.5ML ~~LOC~~ SOAJ: 4.5000 mg | SUBCUTANEOUS | Status: DC

## 2023-01-01 ENCOUNTER — Ambulatory Visit: Payer: BC Managed Care – PPO | Admitting: Family Medicine

## 2023-01-01 NOTE — Progress Notes (Deleted)
   Established Patient Office Visit  Subjective   Patient ID: Steven Estrada, male    DOB: [birth year]  Age: 67 y.o. MRN: [id number]  No chief complaint on file.   HPI   Hypertension- Pt denies chest pain, SOB, dizziness, or heart palpitations.  Taking meds as directed w/o problems.  Denies medication side effects.    Diabetes - no hypoglycemic events. No wounds or sores that are not healing well. No increased thirst or urination. Checking glucose at home. Taking medications as prescribed without any side effects.  {History (Optional):23778}  ROS    Objective:     There were no vitals taken for this visit. {Vitals History (Optional):23777}  Physical Exam   No results found for any visits on 01/01/23.  {Labs (Optional):23779}  The 10-year ASCVD risk score (Arnett DK, et al., 2019) is: 33.2%    Assessment & Plan:   Problem List Items Addressed This Visit       Cardiovascular and Mediastinum   HYPERTENSION, BENIGN - Primary     Endocrine   Controlled diabetes mellitus type 2 with complications ([place])    No follow-ups on file.    Beatrice Lecher, MD

## 2023-01-25 ENCOUNTER — Ambulatory Visit: Payer: Self-pay | Admitting: Family Medicine

## 2023-01-25 DIAGNOSIS — I10 Essential (primary) hypertension: Secondary | ICD-10-CM

## 2023-01-25 DIAGNOSIS — E119 Type 2 diabetes mellitus without complications: Secondary | ICD-10-CM

## 2023-01-29 ENCOUNTER — Ambulatory Visit: Payer: Self-pay | Admitting: Family Medicine

## 2023-01-29 DIAGNOSIS — I10 Essential (primary) hypertension: Secondary | ICD-10-CM

## 2023-01-29 DIAGNOSIS — E119 Type 2 diabetes mellitus without complications: Secondary | ICD-10-CM

## 2023-02-12 ENCOUNTER — Telehealth: Payer: Self-pay | Admitting: Family Medicine

## 2023-02-12 NOTE — Telephone Encounter (Signed)
Pt called.  He is requesting a refill on Trulicity. Pharmacy on file is up to date.

## 2023-02-15 ENCOUNTER — Telehealth: Payer: Self-pay

## 2023-02-15 NOTE — Telephone Encounter (Signed)
Pt called requesting a pa for Trulicity , pa does not have active insurance on file, pt was advised to obtain his most recent insurance card and send a copy to [place] to work on.

## 2023-02-19 ENCOUNTER — Telehealth: Payer: Self-pay

## 2023-02-19 NOTE — Telephone Encounter (Signed)
Spoke with patient. States he is needing PA for Trulicity done.  Found note in chart stating that patient did not show any current insurance information in patient chart.  Asked that he bring in a copy of his insurance card to put on file.  Patient was frustrated stating that his insurance company was suppose to be faxing [place] a copy of this and he is upset that we did not received it.  Patient states he will bring [place] a copy of the card to place in chart.

## 2023-02-19 NOTE — Telephone Encounter (Signed)
Patient will stop by today with the insurance card. He needs a PA for Trulicity.

## 2023-02-20 ENCOUNTER — Telehealth: Payer: Self-pay

## 2023-02-20 NOTE — Telephone Encounter (Addendum)
Initiated Prior authorization ZOX:[id number] 4.5MG /0.5ML pen-injectors Via: Covermymeds Case/Key:[id number] Status: approved  as of 02/20/23 Reason:valid through this request is approved from 02/20/2023 to 02/19/2026 Notified Pt via:Pt  does not have  Mychart , Called pt to notify

## 2023-02-23 ENCOUNTER — Ambulatory Visit: Payer: BC Managed Care – PPO | Admitting: Family Medicine

## 2023-02-23 ENCOUNTER — Encounter: Payer: Self-pay | Admitting: Family Medicine

## 2023-02-23 VITALS — BP 111/64 | HR 82 | Ht 68.0 in | Wt 202.0 lb

## 2023-02-23 DIAGNOSIS — E7800 Pure hypercholesterolemia, unspecified: Secondary | ICD-10-CM | POA: Diagnosis not present

## 2023-02-23 DIAGNOSIS — E119 Type 2 diabetes mellitus without complications: Secondary | ICD-10-CM

## 2023-02-23 DIAGNOSIS — I10 Essential (primary) hypertension: Secondary | ICD-10-CM | POA: Diagnosis not present

## 2023-02-23 DIAGNOSIS — M1A09X0 Idiopathic chronic gout, multiple sites, without tophus (tophi): Secondary | ICD-10-CM | POA: Diagnosis not present

## 2023-02-23 DIAGNOSIS — E118 Type 2 diabetes mellitus with unspecified complications: Secondary | ICD-10-CM | POA: Diagnosis not present

## 2023-02-23 DIAGNOSIS — N529 Male erectile dysfunction, unspecified: Secondary | ICD-10-CM

## 2023-02-23 LAB — POCT GLYCOSYLATED HEMOGLOBIN (HGB A1C): Hemoglobin A1C: 6.5 % — AB (ref 4.0–5.6)

## 2023-02-23 MED ORDER — ROSUVASTATIN CALCIUM 40 MG PO TABS: 40.0000 mg | ORAL_TABLET | Freq: Every day | ORAL | 3 refills | Status: DC

## 2023-02-23 MED ORDER — TRULICITY 4.5 MG/0.5ML ~~LOC~~ SOAJ: 4.5000 mg | SUBCUTANEOUS | 3 refills | Status: DC

## 2023-02-23 MED ORDER — COLCHICINE 0.6 MG PO TABS: 0.6000 mg | ORAL_TABLET | Freq: Every day | ORAL | 1 refills | Status: AC | PRN

## 2023-02-23 MED ORDER — TADALAFIL 20 MG PO TABS
20.0000 mg | ORAL_TABLET | Freq: Every day | ORAL | 99 refills | Status: AC | PRN
Start: 2023-02-23 — End: ?

## 2023-02-23 MED ORDER — OLMESARTAN-AMLODIPINE-HCTZ 40-5-12.5 MG PO TABS
1.0000 | ORAL_TABLET | Freq: Every day | ORAL | 1 refills | Status: DC
Start: 2023-02-23 — End: 2023-11-13

## 2023-02-23 MED ORDER — NITROGLYCERIN 0.4 MG SL SUBL: 0.4000 mg | SUBLINGUAL_TABLET | SUBLINGUAL | 1 refills | Status: AC | PRN

## 2023-02-23 NOTE — Assessment & Plan Note (Signed)
Blood pressure is on low end today.  He says he feels okay.  He has lost some weight and has not been eating as much on the Trulicity.  I encouraged him to make sure he is hydrating well during the day.  But I am also going to decrease the HCTZ component of his Tribenzor.  New prescription sent to pharmacy.

## 2023-02-23 NOTE — Progress Notes (Signed)
Established Patient Office Visit  Subjective   Patient ID: Steven Estrada, male    DOB: 01-08-1956  Age: 67 y.o. MRN: [id number]  Chief Complaint  Patient presents with   Diabetes   Hypertension    HPI  Hypertension- Pt denies chest pain, SOB, dizziness, or heart palpitations.  Taking meds as directed w/o problems.  Denies medication side effects.    Diabetes - no hypoglycemic events. No wounds or sores that are not healing well. No increased thirst or urination. Checking glucose at home. Taking medications as prescribed without any side effects.was having difficulty getting the Trulicity.   Hyperlipidemia - tolerating stating well with no myalgias or significant side effects.  Lab Results  Component Value Date   CHOL 265 (H) 08/28/2022   HDL 38 (L) 08/28/2022   LDLCALC 180 (H) 08/28/2022   LDLDIRECT 125 (H) 06/22/2014   TRIG 264 (H) 08/28/2022   CHOLHDL 7.0 (H) 08/28/2022        ROS    Objective:     BP 111/64   Pulse 82   Ht  (1.727 m)   Wt 202 lb (91.6 kg)   SpO2 98%   BMI 30.71 kg/m    Physical Exam Constitutional:      Appearance: He is well-developed.  HENT:     Head: Normocephalic and atraumatic.  Neck:     Comments: No carotid bruits. Cardiovascular:     Rate and Rhythm: Normal rate and regular rhythm.     Heart sounds: Normal heart sounds.  Pulmonary:     Effort: Pulmonary effort is normal.     Breath sounds: Normal breath sounds.  Skin:    General: Skin is warm and dry.  Neurological:     Mental Status: He is alert and oriented to person, place, and time.  Psychiatric:        Behavior: Behavior normal.      Results for orders placed or performed in visit on 02/23/23  POCT glycosylated hemoglobin (Hb A1C)  Result Value Ref Range   Hemoglobin A1C 6.5 (A) 4.0 - 5.6 %   HbA1c POC (<> result, manual entry)     HbA1c, POC (prediabetic range)     HbA1c, POC (controlled diabetic range)        The 10-year ASCVD risk score  (Arnett DK, et al., 2019) is: 25.2%    Assessment & Plan:   Problem List Items Addressed This Visit       Cardiovascular and Mediastinum   HYPERTENSION, BENIGN - Primary    Blood pressure is on low end today.  He says he feels okay.  He has lost some weight and has not been eating as much on the Trulicity.  I encouraged him to make sure he is hydrating well during the day.  But I am also going to decrease the HCTZ component of his Tribenzor.  New prescription sent to pharmacy.      Relevant Medications   nitroGLYCERIN (NITROSTAT) 0.4 MG SL tablet   rosuvastatin (CRESTOR) 40 MG tablet   tadalafil (CIALIS) 20 MG tablet   Olmesartan-amLODIPine-HCTZ 40-5-12.5 MG TABS   Other Relevant Orders   POCT glycosylated hemoglobin (Hb A1C) (Completed)   COMPLETE METABOLIC PANEL WITH GFR   Lipid Panel w/reflex Direct LDL     Endocrine   Controlled diabetes mellitus type 2 with complications    1C still looks great today at 6.5.  He did miss about a week of the Trulicity.  He is  otherwise doing well his job is a little bit more physically active now which has been good but he is planning on retiring this summer.      Relevant Medications   Dulaglutide (TRULICITY) 4.5 MG/0.5ML SOPN   rosuvastatin (CRESTOR) 40 MG tablet   Olmesartan-amLODIPine-HCTZ 40-5-12.5 MG TABS   Other Relevant Orders   POCT glycosylated hemoglobin (Hb A1C) (Completed)   COMPLETE METABOLIC PANEL WITH GFR   Lipid Panel w/reflex Direct LDL     Other   Hyperlipidemia   Relevant Medications   nitroGLYCERIN (NITROSTAT) 0.4 MG SL tablet   rosuvastatin (CRESTOR) 40 MG tablet   tadalafil (CIALIS) 20 MG tablet   Olmesartan-amLODIPine-HCTZ 40-5-12.5 MG TABS   Other Relevant Orders   POCT glycosylated hemoglobin (Hb A1C) (Completed)   Gout    Failed as needed colchicine.  I am also gena decrease his HCTZ.  Though he has not had any flares recently.      Relevant Medications   colchicine 0.6 MG tablet   ED (erectile  dysfunction)    Reminded him not to combine the nitroglycerin and the Cialis because it can be dangerous.      Relevant Medications   tadalafil (CIALIS) 20 MG tablet   Other Visit Diagnoses     Diabetes mellitus without complication       Relevant Medications   Dulaglutide (TRULICITY) 4.5 MG/0.5ML SOPN   rosuvastatin (CRESTOR) 40 MG tablet   Olmesartan-amLODIPine-HCTZ 40-5-12.5 MG TABS       Return in about 4 months (around 06/25/2023) for Diabetes follow-up, Hypertension.    Nani Gasser, MD

## 2023-02-23 NOTE — Assessment & Plan Note (Signed)
Failed as needed colchicine.  I am also gena decrease his HCTZ.  Though he has not had any flares recently.

## 2023-02-23 NOTE — Assessment & Plan Note (Signed)
1C still looks great today at 6.5.  He did miss about a week of the Trulicity.  He is otherwise doing well his job is a little bit more physically active now which has been good but he is planning on retiring this summer.

## 2023-02-23 NOTE — Assessment & Plan Note (Signed)
Reminded him not to combine the nitroglycerin and the Cialis because it can be dangerous.

## 2023-02-24 LAB — COMPLETE METABOLIC PANEL WITH GFR
AG Ratio: 1.9 (calc) (ref 1.0–2.5)
ALT: 29 U/L (ref 9–46)
AST: 22 U/L (ref 10–35)
Albumin: 4.7 g/dL (ref 3.6–5.1)
Alkaline phosphatase (APISO): 70 U/L (ref 35–144)
BUN: 20 mg/dL (ref 7–25)
CO2: 29 mmol/L (ref 20–32)
Calcium: 10.2 mg/dL (ref 8.6–10.3)
Chloride: 103 mmol/L (ref 98–110)
Creat: 1.18 mg/dL (ref 0.70–1.35)
Globulin: 2.5 g/dL (calc) (ref 1.9–3.7)
Glucose, Bld: 132 mg/dL — ABNORMAL HIGH (ref 65–99)
Potassium: 4.6 mmol/L (ref 3.5–5.3)
Sodium: 140 mmol/L (ref 135–146)
Total Bilirubin: 0.4 mg/dL (ref 0.2–1.2)
Total Protein: 7.2 g/dL (ref 6.1–8.1)
eGFR: 69 mL/min/{1.73_m2} (ref >=60)

## 2023-02-24 LAB — LIPID PANEL W/REFLEX DIRECT LDL
Cholesterol: 172 mg/dL (ref <200)
HDL: 44 mg/dL (ref >=40)
LDL Cholesterol (Calc): 107 mg/dL (calc) — ABNORMAL HIGH
Non-HDL Cholesterol (Calc): 128 mg/dL (calc) (ref <130)
Total CHOL/HDL Ratio: 3.9 (calc) (ref <5.0)
Triglycerides: 112 mg/dL (ref <150)

## 2023-02-26 ENCOUNTER — Ambulatory Visit: Payer: Self-pay | Admitting: Family Medicine

## 2023-02-27 NOTE — Progress Notes (Signed)
Call patient: Metabolic panel is normal.  LDL cholesterol looks much much better.  It was 186 months ago it is down to 107.  Not quite as good as it was a year ago but just continue to work on healthy food choices regular exercise and our goal will be to try to get it under 100 again.

## 2023-04-17 ENCOUNTER — Telehealth: Payer: Self-pay | Admitting: Family Medicine

## 2023-04-17 NOTE — Telephone Encounter (Signed)
1 option would be to drop down to Trulicity 3 mg in the hopes that it would be easier to find, another option would be to switch to Ozempic.

## 2023-04-17 NOTE — Telephone Encounter (Signed)
Patient called stating he was informed by the pharmacy they would no longer be making Trulicity. The patient is  now asking what the alternatives to Dulaglutide (TRULICITY) 4.5 MG/0.5ML SOPN [[id number]]  would be.

## 2023-04-23 MED ORDER — OZEMPIC (2 MG/DOSE) 8 MG/3ML ~~LOC~~ SOPN: 2.0000 mg | PEN_INJECTOR | SUBCUTANEOUS | 0 refills | Status: DC

## 2023-04-23 NOTE — Telephone Encounter (Signed)
Called pt to advise him of Ozempic being sent in and for him to take 2 mg weekly. Should he have questions to call back.

## 2023-04-23 NOTE — Telephone Encounter (Signed)
Patient called to do a follow up on medication he states that he can't get Trulicity at all and request to change to Ozempic please advise

## 2023-05-12 ENCOUNTER — Other Ambulatory Visit: Payer: Self-pay | Admitting: Family Medicine

## 2023-05-12 DIAGNOSIS — I10 Essential (primary) hypertension: Secondary | ICD-10-CM

## 2023-05-30 ENCOUNTER — Telehealth: Payer: Self-pay | Admitting: Family Medicine

## 2023-05-30 NOTE — Telephone Encounter (Signed)
Patient is calling back to get a PA follow up on Ozempic 2mg  Dose Patient is completely out He request a call back with status [phone]

## 2023-05-31 ENCOUNTER — Telehealth: Payer: Self-pay

## 2023-05-31 NOTE — Telephone Encounter (Signed)
Initiated Prior authorization [id number] (2 MG/DOSE) 8MG /3ML pen-injectors Via: Covermymeds Case/Key:[id number] Status: Pending as of 05/31/23 Reason: Notified Pt via: pt does not have Mychart

## 2023-06-01 ENCOUNTER — Telehealth: Payer: Self-pay | Admitting: Family Medicine

## 2023-06-01 NOTE — Telephone Encounter (Signed)
Is call my eye doctor for copy of last eye exam to be sent/faxed to our office.

## 2023-06-08 ENCOUNTER — Other Ambulatory Visit: Payer: Self-pay | Admitting: *Deleted

## 2023-06-08 DIAGNOSIS — E119 Type 2 diabetes mellitus without complications: Secondary | ICD-10-CM

## 2023-06-12 ENCOUNTER — Ambulatory Visit: Payer: 59

## 2023-06-12 ENCOUNTER — Ambulatory Visit (INDEPENDENT_AMBULATORY_CARE_PROVIDER_SITE_OTHER): Payer: Self-pay | Admitting: Family Medicine

## 2023-06-12 ENCOUNTER — Encounter: Payer: Self-pay | Admitting: Family Medicine

## 2023-06-12 VITALS — BP 128/75 | HR 80 | Ht 68.0 in | Wt 200.0 lb

## 2023-06-12 DIAGNOSIS — M25531 Pain in right wrist: Secondary | ICD-10-CM

## 2023-06-12 DIAGNOSIS — M549 Dorsalgia, unspecified: Secondary | ICD-10-CM

## 2023-06-12 DIAGNOSIS — M545 Low back pain, unspecified: Secondary | ICD-10-CM

## 2023-06-12 DIAGNOSIS — M25532 Pain in left wrist: Secondary | ICD-10-CM

## 2023-06-12 MED ORDER — TRAMADOL HCL 50 MG PO TABS: 50.0000 mg | ORAL_TABLET | Freq: Every evening | ORAL | 0 refills | Status: AC | PRN

## 2023-06-12 MED ORDER — CYCLOBENZAPRINE HCL 10 MG PO TABS: 10.0000 mg | ORAL_TABLET | Freq: Three times a day (TID) | ORAL | 0 refills | Status: AC | PRN
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
## 2023-06-12 NOTE — Patient Instructions (Addendum)
Start extra strength Tylenol, 2 tabs 3 times a day for the next week to get your pain and inflammation under better control.

## 2023-06-18 ENCOUNTER — Other Ambulatory Visit: Payer: Self-pay | Admitting: *Deleted

## 2023-06-18 DIAGNOSIS — M545 Low back pain, unspecified: Secondary | ICD-10-CM

## 2023-06-18 DIAGNOSIS — M549 Dorsalgia, unspecified: Secondary | ICD-10-CM

## 2023-06-18 NOTE — Progress Notes (Signed)
Hi Steven Estrada, there is definitely a lot of spasm indicated by the straightening of the cervical lordosis.  No sign of fracture which is very reassuring.  You do have some arthritis there and a little bit of curvature.  There is a little shifting of L4 on top of L5 secondary to some arthritis at the hinge part of the spine.  Please let us know how you are doing.  We can always get you in with physical therapy if you are still having a lot of stiffness soreness and pain.

## 2023-06-18 NOTE — Progress Notes (Signed)
Hi Steven Estrada, no sign of fracture on the upper back area just some arthritis and curvature.  Again would recommend formal physical therapy depending on your level of pain and stiffness that you are currently experiencing.

## 2023-06-21 ENCOUNTER — Telehealth: Payer: Self-pay | Admitting: Family Medicine

## 2023-06-21 NOTE — Telephone Encounter (Signed)
Forms completed and placed in tony B basket

## 2023-06-21 NOTE — Telephone Encounter (Signed)
Pt's forms placed up front in blue folder outside of front office for pt to be contacted for payment. Forms will need to be faxed, and scanned into pt's chart.

## 2023-06-22 ENCOUNTER — Other Ambulatory Visit: Payer: Self-pay | Admitting: Family Medicine

## 2023-06-22 DIAGNOSIS — I10 Essential (primary) hypertension: Secondary | ICD-10-CM

## 2023-06-25 ENCOUNTER — Encounter: Payer: Self-pay | Admitting: Family Medicine

## 2023-06-25 ENCOUNTER — Ambulatory Visit (INDEPENDENT_AMBULATORY_CARE_PROVIDER_SITE_OTHER): Payer: 59 | Admitting: Family Medicine

## 2023-06-25 VITALS — BP 124/76 | HR 86 | Ht 68.0 in | Wt 197.0 lb

## 2023-06-25 DIAGNOSIS — E119 Type 2 diabetes mellitus without complications: Secondary | ICD-10-CM

## 2023-06-25 DIAGNOSIS — Z7984 Long term (current) use of oral hypoglycemic drugs: Secondary | ICD-10-CM

## 2023-06-25 DIAGNOSIS — I10 Essential (primary) hypertension: Secondary | ICD-10-CM

## 2023-06-25 DIAGNOSIS — I119 Hypertensive heart disease without heart failure: Secondary | ICD-10-CM

## 2023-06-25 DIAGNOSIS — M549 Dorsalgia, unspecified: Secondary | ICD-10-CM

## 2023-06-25 DIAGNOSIS — I2510 Atherosclerotic heart disease of native coronary artery without angina pectoris: Secondary | ICD-10-CM | POA: Diagnosis not present

## 2023-06-25 DIAGNOSIS — M25532 Pain in left wrist: Secondary | ICD-10-CM

## 2023-06-25 DIAGNOSIS — M25531 Pain in right wrist: Secondary | ICD-10-CM

## 2023-06-25 DIAGNOSIS — E118 Type 2 diabetes mellitus with unspecified complications: Secondary | ICD-10-CM | POA: Diagnosis not present

## 2023-06-25 LAB — POCT GLYCOSYLATED HEMOGLOBIN (HGB A1C): Hemoglobin A1C: 11.3 % — AB (ref 4.0–5.6)

## 2023-06-25 LAB — POCT UA - MICROALBUMIN
Creatinine, POC: 200 mg/dL
Microalbumin Ur, POC: 80 mg/L

## 2023-06-25 MED ORDER — TIRZEPATIDE 12.5 MG/0.5ML ~~LOC~~ SOAJ: 12.5000 mg | SUBCUTANEOUS | 0 refills | Status: DC

## 2023-06-25 MED ORDER — METFORMIN HCL ER 500 MG PO TB24: 500.0000 mg | ORAL_TABLET | Freq: Two times a day (BID) | ORAL | 0 refills | Status: DC

## 2023-06-25 NOTE — Assessment & Plan Note (Signed)
Well controlled. Continue current regimen. Follow up in  6 mo  

## 2023-06-25 NOTE — Progress Notes (Signed)
Typically urine is negative for protein but just showing some slight spillage today.  He is already on an ARB.  Will continue to monitor.
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
## 2023-06-25 NOTE — Assessment & Plan Note (Signed)
Well controlled. Continue current regimen. Follow up in  3-4 months.  

## 2023-06-25 NOTE — Assessment & Plan Note (Signed)
No recent CP.   

## 2023-06-27 ENCOUNTER — Encounter: Payer: Self-pay | Admitting: Physical Therapy

## 2023-06-27 ENCOUNTER — Telehealth: Payer: Self-pay | Admitting: *Deleted

## 2023-06-27 ENCOUNTER — Other Ambulatory Visit: Payer: Self-pay

## 2023-06-27 ENCOUNTER — Ambulatory Visit: Payer: 59 | Attending: Family Medicine | Admitting: Physical Therapy

## 2023-06-27 DIAGNOSIS — M546 Pain in thoracic spine: Secondary | ICD-10-CM | POA: Insufficient documentation

## 2023-06-27 DIAGNOSIS — M6281 Muscle weakness (generalized): Secondary | ICD-10-CM | POA: Diagnosis not present

## 2023-06-27 DIAGNOSIS — M545 Low back pain, unspecified: Secondary | ICD-10-CM | POA: Insufficient documentation

## 2023-06-27 NOTE — Telephone Encounter (Signed)
OV note faxed to [facility] disability
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
## 2023-06-29 ENCOUNTER — Ambulatory Visit: Payer: 59 | Admitting: Physical Therapy

## 2023-06-29 ENCOUNTER — Encounter: Payer: Self-pay | Admitting: Physical Therapy

## 2023-06-29 DIAGNOSIS — M546 Pain in thoracic spine: Secondary | ICD-10-CM | POA: Diagnosis not present

## 2023-06-29 DIAGNOSIS — M6281 Muscle weakness (generalized): Secondary | ICD-10-CM
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
## 2023-07-03 ENCOUNTER — Ambulatory Visit: Payer: 59

## 2023-07-03 DIAGNOSIS — M546 Pain in thoracic spine: Secondary | ICD-10-CM | POA: Diagnosis not present

## 2023-07-03 DIAGNOSIS — M6281 Muscle weakness (generalized): Secondary | ICD-10-CM

## 2023-07-03 NOTE — Patient Instructions (Signed)

## 2023-07-06 ENCOUNTER — Encounter: Payer: Self-pay | Admitting: Physical Therapy

## 2023-07-06 ENCOUNTER — Ambulatory Visit: Payer: 59 | Admitting: Physical Therapy

## 2023-07-06 DIAGNOSIS — M6281 Muscle weakness (generalized): Secondary | ICD-10-CM

## 2023-07-06 DIAGNOSIS — M546 Pain in thoracic spine: Secondary | ICD-10-CM
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
## 2023-07-09 ENCOUNTER — Ambulatory Visit
Admission: EM | Admit: 2023-07-09 | Discharge: 2023-07-09 | Disposition: A | Discharge status: Home / Self Care | Payer: 59 | Attending: Family Medicine | Admitting: Family Medicine

## 2023-07-09 DIAGNOSIS — R739 Hyperglycemia, unspecified: Secondary | ICD-10-CM | POA: Diagnosis not present

## 2023-07-09 LAB — POCT FASTING CBG KUC MANUAL ENTRY: POCT Glucose (KUC): 330 mg/dL — AB (ref 70–99)

## 2023-07-09 NOTE — Discharge Instructions (Addendum)
Advised patient to go to [facility] ED now for further evaluation of hyperglycemia.

## 2023-07-09 NOTE — ED Triage Notes (Signed)
Pt started on Metformin about 6 weeks ago after change from Trulicity to Ozempic. Pt had an approx 10 day period when he was out of Trulicity prior to starting the Ozempic. Since that time he BG has been reading in the 200's-300's. (Highest was last night - 354). Has f/u appt at the end of this month.

## 2023-07-09 NOTE — ED Notes (Signed)
Patient is being discharged from the Urgent Care and sent to the Emergency Department via POV. Per Trevor Iha FNP, patient is in need of higher level of care due to hyperglycemia. Patient is aware and verbalizes understanding of plan of care.  Vitals:   07/09/23 1055  BP: 108/74  Pulse: 85  Resp: 18  Temp: 98.4 F (36.9 C)  SpO2: 96%

## 2023-07-09 NOTE — ED Provider Notes (Signed)
[facility]    CSN: [id number] Arrival date & time: 07/09/23  1042      History   Chief Complaint Chief Complaint  Patient presents with   Hyperglycemia    HPI Sully C. Sipin is a 67 y.o. male.   HPI 67 year old male presents with elevated blood sugar.  PMH significant for CAD, HTN, and T2DM.  Patient presents with elevated blood sugar.  Patient is currently on Plavix and denies any unusual bleeding.  Patient reports PCP started on metformin 6 weeks ago after change from Trulicity to Ozempic.  Patient reports 10-day period when he was out of Trulicity prior to starting Ozempic.  Past Medical History:  Diagnosis Date   Diabetes mellitus without complication (HCC)    Hx of cardiovascular stress test    a. ETT-Myoview 3/14:  EF 56%, + inferior ischemia   Hyperlipidemia    Hypertension     Patient Active Problem List   Diagnosis Date Noted   ED (erectile dysfunction) 02/23/2023   Heart murmur 09/19/2021   Coronary artery disease involving native coronary artery of native heart without angina pectoris 09/24/2018   Abnormal stress test 05/26/2015   Onychomycosis 09/01/2013   Gout 09/01/2013   CAD (coronary artery disease) 02/03/2013   Controlled diabetes mellitus type 2 with complications (HCC) 02/22/2010   HYPERTENSION, BENIGN 02/02/2010   HEMATURIA UNSPECIFIED 02/02/2010   DERMATITIS, SEBORRHEIC 02/02/2010   NOCTURIA 02/02/2010   Hyperlipidemia 12/28/2008   HYPERTROPHY PROSTATE W/O UR OBST & OTH LUTS 12/21/2008    Past Surgical History:  Procedure Laterality Date   CORONARY ANGIOPLASTY WITH STENT PLACEMENT  04/2013   Dr. Chales Abrahams at [facility]   LEFT HEART CATHETERIZATION WITH CORONARY ANGIOGRAM N/A 01/31/2013   Procedure: LEFT HEART CATHETERIZATION WITH CORONARY ANGIOGRAM;  Surgeon: Peter M Swaziland, MD;  Location: [facility] CATH LAB;  Service: Cardiovascular;  Laterality: N/A;       Home Medications    Prior to Admission medications   Medication Sig Start Date  End Date Taking? Authorizing Provider  allopurinol (ZYLOPRIM) 300 MG tablet TAKE ONE & ONE-HALF TABLETS BY MOUTH ONCE DAILY 05/01/22  Yes Agapito Games, MD  aspirin EC 81 MG tablet Take 81 mg by mouth daily. Swallow whole.   Yes [provider]  fish oil-omega-3 fatty acids 1000 MG capsule Take 1 g by mouth daily.   Yes [provider]  metFORMIN (GLUCOPHAGE-XR) 500 MG 24 hr tablet Take 1 tablet (500 mg total) by mouth 2 (two) times daily with a meal. 06/25/23  Yes Agapito Games, MD  metoprolol tartrate (LOPRESSOR) 25 MG tablet Take 1 tablet by mouth twice daily 10/03/22  Yes Agapito Games, MD  Olmesartan-amLODIPine-HCTZ 40-5-12.5 MG TABS Take 1 tablet by mouth daily. 02/23/23  Yes Agapito Games, MD  rosuvastatin (CRESTOR) 40 MG tablet Take 1 tablet (40 mg total) by mouth at bedtime. 02/23/23  Yes Agapito Games, MD  clopidogrel (PLAVIX) 75 MG tablet  04/06/14   [provider]  colchicine 0.6 MG tablet Take 1 tablet (0.6 mg total) by mouth daily as needed. 02/23/23   Agapito Games, MD  cyclobenzaprine (FLEXERIL) 10 MG tablet Take 1 tablet (10 mg total) by mouth 3 (three) times daily as needed for muscle spasms. 06/12/23   Agapito Games, MD  nitroGLYCERIN (NITROSTAT) 0.4 MG SL tablet Place 1 tablet (0.4 mg total) under the tongue every 5 (five) minutes as needed for chest pain. 02/23/23   Agapito Games, MD  NON FORMULARY Glucometer, strips, and lancets to check dailly    [provider]  tadalafil (CIALIS) 20 MG tablet Take 1 tablet (20 mg total) by mouth daily as needed for erectile dysfunction. 02/23/23   Agapito Games, MD  tirzepatide [facility]) 12.5 MG/0.5ML Pen Inject 12.5 mg into the skin once a week. Patient not taking: Reported on 07/09/2023 06/25/23   Agapito Games, MD    Family History Family History  Problem Relation Age of Onset   Heart failure Mother 51   Hypertension Sister     Hypertension Brother    Hypertension Sister     Social History Social History   Tobacco Use   Smoking status: Never   Smokeless tobacco: Never  Vaping Use   Vaping status: Never Used  Substance Use Topics   Alcohol use: Yes    Comment: 12 per month   Drug use: No     Allergies   Patient has no known allergies.   Review of Systems Review of Systems  Endocrine:       Hyperglycemia CBG 330 in triage     Physical Exam Triage Vital Signs ED Triage Vitals [07/09/23 1049]  Encounter Vitals Group     BP      Systolic BP Percentile      Diastolic BP Percentile      Pulse      Resp      Temp      Temp src      SpO2      Weight      Height      Head Circumference      Peak Flow      Pain Score 0     Pain Loc      Pain Education      Exclude from Growth Chart    No data found.  Updated Vital Signs BP 108/74 (BP Location: Right Arm)   Pulse 85   Temp 98.4 F (36.9 C) (Oral)   Resp 18   SpO2 96%    Physical Exam Vitals and nursing note reviewed.  Constitutional:      Appearance: Normal appearance. He is obese.  HENT:     Head: Normocephalic and atraumatic.     Right Ear: Tympanic membrane, ear canal and external ear normal.     Left Ear: Tympanic membrane, ear canal and external ear normal.     Mouth/Throat:     Mouth: Mucous membranes are moist.  Eyes:     Extraocular Movements: Extraocular movements intact.     Conjunctiva/sclera: Conjunctivae normal.     Pupils: Pupils are equal, round, and reactive to light.  Cardiovascular:     Rate and Rhythm: Normal rate and regular rhythm.     Pulses: Normal pulses.     Heart sounds: Normal heart sounds.  Pulmonary:     Effort: Pulmonary effort is normal.     Breath sounds: Normal breath sounds. No wheezing or rhonchi.  Musculoskeletal:        General: Normal range of motion.     Cervical back: Normal range of motion and neck supple.  Skin:    General: Skin is warm and dry.  Neurological:     General:  No focal deficit present.     Mental Status: He is alert and oriented to person, place, and time. Mental status is at baseline.  Psychiatric:        Mood and Affect: Mood normal.  Behavior: Behavior normal.        Thought Content: Thought content normal.      UC Treatments / Results  Labs (all labs ordered are listed, but only abnormal results are displayed) Labs Reviewed  POCT FASTING CBG [facility] MANUAL ENTRY - Abnormal; Notable for the following components:      Result Value   POCT Glucose ([facility]) 330 (*)    All other components within normal limits    EKG   Radiology No results found.  Procedures Procedures (including critical care time)  Medications Ordered in UC Medications - No data to display  Initial Impression / Assessment and Plan / UC Course  I have reviewed the triage vital signs and the nursing notes.  Pertinent labs & imaging results that were available during my care of the patient were reviewed by me and considered in my medical decision making (see chart for details).     MDM: 1.  Hyperglycemia-CBG 330 in triage.  Advised patient to go to Advised patient to go to [facility] ED now for further evaluation of hyperglycemia.  Patient agreed and verbalized understanding of these instructions and this plan of care today.  Patient discharged to ED hemodynamically stable.  Final diagnoses:  Hyperglycemia     Discharge Instructions      Advised patient to go to [facility] ED now for further evaluation of hyperglycemia.     ED Prescriptions   None    PDMP not reviewed this encounter.   Trevor Iha, FNP 07/09/23 1120

## 2023-07-10 ENCOUNTER — Encounter: Payer: Self-pay | Admitting: Physical Therapy

## 2023-07-10 ENCOUNTER — Telehealth: Payer: Self-pay | Admitting: Family Medicine

## 2023-07-10 ENCOUNTER — Ambulatory Visit: Payer: 59 | Attending: Family Medicine | Admitting: Physical Therapy

## 2023-07-10 DIAGNOSIS — M6281 Muscle weakness (generalized): Secondary | ICD-10-CM | POA: Diagnosis present

## 2023-07-10 DIAGNOSIS — M546 Pain in thoracic spine: Secondary | ICD-10-CM | POA: Insufficient documentation

## 2023-07-10 NOTE — Telephone Encounter (Signed)
Routing to pcp

## 2023-07-10 NOTE — Telephone Encounter (Signed)
Patient called in stating that Ozempic is not working for him and would like to go back to [employer].  Please advise.

## 2023-07-10 NOTE — Telephone Encounter (Signed)
What dose is he on? It may be we need to increase his dose. It is usually more powerful than trulitiy. I don't see on his med list
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
## 2023-07-11 NOTE — Telephone Encounter (Signed)
Patient states that he is currently taking Ozepmic  2.0 mg and that he was taking Trulicity .5mg .

## 2023-07-12 ENCOUNTER — Encounter: Payer: Self-pay | Admitting: Physical Therapy

## 2023-07-12 ENCOUNTER — Ambulatory Visit: Payer: 59 | Admitting: Physical Therapy

## 2023-07-12 DIAGNOSIS — M6281 Muscle weakness (generalized): Secondary | ICD-10-CM

## 2023-07-12 DIAGNOSIS — M546 Pain in thoracic spine: Secondary | ICD-10-CM

## 2023-07-13 MED ORDER — TRULICITY 3 MG/0.5ML ~~LOC~~ SOAJ: 3.0000 mg | SUBCUTANEOUS | 0 refills | Status: DC

## 2023-07-13 NOTE — Telephone Encounter (Signed)
Okay, new prescription sent for Trulicity will start him back at 3 mg which is closer to the current dose on the Ozempic he is using and try to work up to the 4.5 to better control his A1c.  Meds ordered this encounter  Medications   Dulaglutide (TRULICITY) 3 MG/0.5ML SOPN    Sig: Inject 3 mg as directed once a week.    Dispense:  6 mL    Refill:  0    Discontinue any remaining orders for Ozempic.

## 2023-07-13 NOTE — Telephone Encounter (Signed)
Attempted to contact patient. Left a voice mail message requesting a return call.

## 2023-07-16 NOTE — Telephone Encounter (Signed)
Patient informed. 

## 2023-07-17 ENCOUNTER — Ambulatory Visit: Payer: 59

## 2023-07-17 DIAGNOSIS — M546 Pain in thoracic spine: Secondary | ICD-10-CM

## 2023-07-17 DIAGNOSIS — M6281 Muscle weakness (generalized): Secondary | ICD-10-CM
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
## 2023-07-19 ENCOUNTER — Ambulatory Visit: Payer: 59 | Admitting: Physical Therapy

## 2023-07-19 ENCOUNTER — Encounter: Payer: Self-pay | Admitting: Physical Therapy

## 2023-07-19 DIAGNOSIS — M546 Pain in thoracic spine: Secondary | ICD-10-CM | POA: Diagnosis not present

## 2023-07-19 DIAGNOSIS — M6281 Muscle weakness (generalized): Secondary | ICD-10-CM
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
## 2023-07-24 ENCOUNTER — Ambulatory Visit: Payer: 59 | Admitting: Physical Therapy

## 2023-08-06 ENCOUNTER — Encounter: Payer: Self-pay | Admitting: Family Medicine

## 2023-08-06 ENCOUNTER — Ambulatory Visit (INDEPENDENT_AMBULATORY_CARE_PROVIDER_SITE_OTHER): Payer: 59 | Admitting: Family Medicine

## 2023-08-06 VITALS — BP 121/76 | HR 92 | Ht 68.0 in | Wt 195.0 lb

## 2023-08-06 DIAGNOSIS — I10 Essential (primary) hypertension: Secondary | ICD-10-CM | POA: Diagnosis not present

## 2023-08-06 DIAGNOSIS — Z23 Encounter for immunization: Secondary | ICD-10-CM | POA: Diagnosis not present

## 2023-08-06 DIAGNOSIS — E1165 Type 2 diabetes mellitus with hyperglycemia: Secondary | ICD-10-CM | POA: Diagnosis not present

## 2023-08-06 DIAGNOSIS — I2510 Atherosclerotic heart disease of native coronary artery without angina pectoris: Secondary | ICD-10-CM

## 2023-08-06 DIAGNOSIS — E118 Type 2 diabetes mellitus with unspecified complications: Secondary | ICD-10-CM

## 2023-08-06 LAB — POCT GLYCOSYLATED HEMOGLOBIN (HGB A1C): Hemoglobin A1C: 11.2 % — AB (ref 4.0–5.6)

## 2023-08-06 MED ORDER — TIRZEPATIDE 10 MG/0.5ML ~~LOC~~ SOAJ
10.0000 mg | SUBCUTANEOUS | 0 refills | Status: DC
Start: 2023-08-06 — End: 2023-12-13

## 2023-08-06 MED ORDER — TIRZEPATIDE 12.5 MG/0.5ML ~~LOC~~ SOAJ
12.5000 mg | SUBCUTANEOUS | 0 refills | Status: DC
Start: 2023-08-26 — End: 2023-12-13

## 2023-08-06 MED ORDER — XIGDUO XR 10-500 MG PO TB24
1.0000 | ORAL_TABLET | Freq: Every day | ORAL | 1 refills | Status: DC
Start: 2023-08-06 — End: 2023-12-13

## 2023-08-06 NOTE — Assessment & Plan Note (Signed)
A1c not well-controlled at all.  Organ to add Farxiga to the metformin new prescription sent to pharmacy and also going to try to switch the Trulicity over to [facility] for better wound see reduction he has lost about 2 more pounds since I last saw him.

## 2023-08-06 NOTE — Progress Notes (Signed)
Established Patient Office Visit  Subjective   Patient ID: Steven Estrada, male    DOB: 1956-09-19  Age: 67 y.o. MRN: [id number]  Chief Complaint  Patient presents with   Diabetes    HPI Diabetes - no hypoglycemic events. No wounds or sores that are not healing well. No increased thirst or urination. Checking glucose at home. Taking medications as prescribed without any side effects.  He says he really does not drink sugary beverages or eat a lot of sweets.  He said he really does not eat a lot of carbs but does eat some more starchy foods.  Says his eye exam is scheduled next month at eye care center here in [place]  Hypertension- Pt denies chest pain, SOB, dizziness, or heart palpitations.  Taking meds as directed w/o problems.  Denies medication side effects.    F/U CAD - no recent chest pain.       ROS    Objective:     BP 121/76   Pulse 92   Ht 5\' 8"  (1.727 m)   Wt 195 lb (88.5 kg)   SpO2 99%   BMI 29.65 kg/m    Physical Exam Vitals and nursing note reviewed.  Constitutional:      Appearance: Normal appearance.  HENT:     Head: Normocephalic and atraumatic.  Eyes:     Conjunctiva/sclera: Conjunctivae normal.  Cardiovascular:     Rate and Rhythm: Normal rate and regular rhythm.  Pulmonary:     Effort: Pulmonary effort is normal.     Breath sounds: Normal breath sounds.  Skin:    General: Skin is warm and dry.  Neurological:     Mental Status: He is alert.  Psychiatric:        Mood and Affect: Mood normal.      Results for orders placed or performed in visit on 08/06/23  POCT HgB A1C  Result Value Ref Range   Hemoglobin A1C 11.2 (A) 4.0 - 5.6 %   HbA1c POC (<> result, manual entry)     HbA1c, POC (prediabetic range)     HbA1c, POC (controlled diabetic range)        The 10-year ASCVD risk score (Arnett DK, et al., 2019) is: 25.8%    Assessment & Plan:   Problem List Items Addressed This Visit       Cardiovascular and Mediastinum    HYPERTENSION, BENIGN - Primary    Well controlled. Continue current regimen. Follow up in  26mo       Coronary artery disease involving native coronary artery of native heart without angina pectoris     Endocrine   Controlled diabetes mellitus type 2 with complications (HCC)    A1c not well-controlled at all.  Organ to add Farxiga to the metformin new prescription sent to pharmacy and also going to try to switch the Trulicity over to [facility] for better wound see reduction he has lost about 2 more pounds since I last saw him.      Relevant Medications   tirzepatide [facility]) 10 MG/0.5ML Pen   tirzepatide (MOUNJARO) 12.5 MG/0.5ML Pen (Start on 08/26/2023)   Dapagliflozin Pro-metFORMIN ER (XIGDUO XR) 10-500 MG TB24   Other Relevant Orders   POCT HgB A1C (Completed)   Other Visit Diagnoses     Encounter for immunization       Relevant Orders   Flu Vaccine Trivalent High Dose (Fluad) (Completed)   Uncontrolled type 2 diabetes mellitus with hyperglycemia (HCC)  Relevant Medications   tirzepatide [facility] 10 MG/0.5ML Pen   tirzepatide (MOUNJARO) 12.5 MG/0.5ML Pen (Start on 08/26/2023)   Dapagliflozin Pro-metFORMIN ER (XIGDUO XR) 10-500 MG TB24       Return in about 3 months (around 11/05/2023) for Diabetes follow-up, Hypertension.    Nani Gasser, MD

## 2023-08-06 NOTE — Assessment & Plan Note (Signed)
Well controlled. Continue current regimen. Follow up in  6 mo  

## 2023-08-06 NOTE — Patient Instructions (Signed)
The Davonna Belling will take place of the metformin The Greggory Keen will take the place of Trulicity.

## 2023-08-13 ENCOUNTER — Telehealth: Payer: Self-pay | Admitting: Family Medicine

## 2023-08-13 NOTE — Telephone Encounter (Signed)
Patient is following up on an appointment with sports medicine he is asking if an appointment has been scheduled or does he need to make one

## 2023-08-13 NOTE — Telephone Encounter (Signed)
OK To schedule with Dr. Karie Schwalbe or if he would prefer to see someone else am happy to place a referral.

## 2023-08-16 ENCOUNTER — Ambulatory Visit (INDEPENDENT_AMBULATORY_CARE_PROVIDER_SITE_OTHER): Payer: 59 | Admitting: Sports Medicine

## 2023-08-16 ENCOUNTER — Encounter: Payer: Self-pay | Admitting: Sports Medicine

## 2023-08-16 DIAGNOSIS — M25531 Pain in right wrist: Secondary | ICD-10-CM

## 2023-08-16 DIAGNOSIS — M25532 Pain in left wrist: Secondary | ICD-10-CM

## 2023-08-16 MED ORDER — MELOXICAM 15 MG PO TABS
ORAL_TABLET | ORAL | 3 refills | Status: DC
Start: 2023-08-16 — End: 2023-12-18
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
## 2023-08-20 ENCOUNTER — Ambulatory Visit: Payer: 59 | Attending: Sports Medicine

## 2023-08-20 ENCOUNTER — Other Ambulatory Visit: Payer: Self-pay

## 2023-08-20 DIAGNOSIS — M6281 Muscle weakness (generalized): Secondary | ICD-10-CM | POA: Insufficient documentation

## 2023-08-20 DIAGNOSIS — M25532 Pain in left wrist: Secondary | ICD-10-CM | POA: Insufficient documentation

## 2023-08-20 DIAGNOSIS — M25531 Pain in right wrist: Secondary | ICD-10-CM | POA: Insufficient documentation
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
## 2023-08-23 ENCOUNTER — Ambulatory Visit: Payer: 59 | Admitting: Physical Therapy

## 2023-08-23 ENCOUNTER — Encounter: Payer: Self-pay | Admitting: Physical Therapy

## 2023-08-23 DIAGNOSIS — M25531 Pain in right wrist: Secondary | ICD-10-CM

## 2023-08-23 DIAGNOSIS — M6281 Muscle weakness (generalized): Secondary | ICD-10-CM
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
## 2023-08-27 ENCOUNTER — Telehealth: Payer: Self-pay

## 2023-08-27 ENCOUNTER — Ambulatory Visit: Payer: 59

## 2023-08-27 NOTE — Telephone Encounter (Signed)
Called patient about no show for today's appointment - spoke with patient on phone, who said he thought his appointment was on Tuesday. Confirmed next appointment with patient on 10/24 before ending call.  Carlynn Herald, PTA 08/27/2023 9:17 AM

## 2023-08-30 ENCOUNTER — Ambulatory Visit: Payer: 59

## 2023-08-30 DIAGNOSIS — M25531 Pain in right wrist: Secondary | ICD-10-CM

## 2023-08-30 DIAGNOSIS — M6281 Muscle weakness (generalized): Secondary | ICD-10-CM
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
## 2023-09-04 ENCOUNTER — Telehealth: Payer: Self-pay

## 2023-09-04 NOTE — Telephone Encounter (Signed)
Ok for letter to return to work

## 2023-09-04 NOTE — Telephone Encounter (Signed)
Steven Estrada called and states he needs a letter to return to work. He states it has to come from his primary provider.

## 2023-09-05 ENCOUNTER — Encounter: Payer: Self-pay | Admitting: Family Medicine

## 2023-09-05 NOTE — Telephone Encounter (Signed)
Letter written. Patient aware to come by and pick up.

## 2023-09-27 ENCOUNTER — Ambulatory Visit (INDEPENDENT_AMBULATORY_CARE_PROVIDER_SITE_OTHER): Payer: 59 | Admitting: Sports Medicine

## 2023-09-27 DIAGNOSIS — M25532 Pain in left wrist: Secondary | ICD-10-CM | POA: Diagnosis not present

## 2023-09-27 DIAGNOSIS — M25531 Pain in right wrist: Secondary | ICD-10-CM | POA: Diagnosis not present
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
## 2023-11-05 ENCOUNTER — Ambulatory Visit: Payer: 59 | Admitting: Family Medicine

## 2023-11-13 ENCOUNTER — Other Ambulatory Visit: Payer: Self-pay | Admitting: Family Medicine

## 2023-11-13 DIAGNOSIS — I10 Essential (primary) hypertension: Secondary | ICD-10-CM

## 2023-11-16 ENCOUNTER — Telehealth: Payer: Self-pay

## 2023-11-16 NOTE — Progress Notes (Signed)
 Care Guide Pharmacy Note  11/16/2023 Name: Steven Estrada MRN: [id number] DOB: 1956-01-25  Referred By: Alvan Dorothyann BIRCH, MD Reason for referral: Care Coordination ([facility] Diabetes. )   Steven Estrada is a 68 y.o. year old male who is a primary care patient of Alvan, Dorothyann BIRCH, MD.  Steven Estrada was referred to the pharmacist for assistance related to: DMII  Successful contact was made with the patient to discuss pharmacy services.  Patient declines engagement at this time. Contact information was provided to the patient should they wish to reach out for assistance at a later time.  Dreama Lynwood [facility] Assistant [phone]

## 2023-12-05 ENCOUNTER — Telehealth: Payer: Self-pay | Admitting: Family Medicine

## 2023-12-05 NOTE — Telephone Encounter (Signed)
Call patient and get him scheduled for a follow-up diabetes appointment he is overdue.  Also make sure that he has been able to get his diabetes medicines because I want to see how they are working and affecting that A1c.

## 2023-12-06 ENCOUNTER — Telehealth: Payer: Self-pay | Admitting: Family Medicine

## 2023-12-06 NOTE — Telephone Encounter (Signed)
Patient stopped by to drop orr DOT Physical Evaluation Notification.  Notification placed in Dr. Shelah Lewandowsky box today.

## 2023-12-07 ENCOUNTER — Telehealth: Payer: Self-pay | Admitting: Family Medicine

## 2023-12-07 NOTE — Telephone Encounter (Signed)
Copied from CRM [id number]. Topic: General - Other >> Dec 07, 2023 12:45 PM Elle L wrote: Reason for CRM: Irving Burton with [employer] [facility] was following up on their fax from 1/29. Their Fax: [phone] Phone: [phone].

## 2023-12-07 NOTE — Telephone Encounter (Signed)
Please call pt and see if he is taking the moujaro and the [place]. I know he has appt next week but I want to make sure on both meds or if having difficulty with getting medications. 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

## 2023-12-11 NOTE — Telephone Encounter (Signed)
 Copied from CRM [id number]. Topic: Clinical - Medical Advice >> Dec 10, 2023  3:57 PM Monisha R wrote: Reason for CRM:  Damien from [employer] is calling about a fax that was sent over and wants to get a follow up of what is going on with fax. Can please follow up with Damien at [phone] direct line.

## 2023-12-13 ENCOUNTER — Telehealth: Payer: Self-pay | Admitting: Family Medicine

## 2023-12-13 ENCOUNTER — Ambulatory Visit (INDEPENDENT_AMBULATORY_CARE_PROVIDER_SITE_OTHER): Payer: Self-pay | Admitting: Family Medicine

## 2023-12-13 ENCOUNTER — Encounter: Payer: Self-pay | Admitting: Family Medicine

## 2023-12-13 VITALS — BP 120/82 | HR 79 | Ht 68.0 in | Wt 200.0 lb

## 2023-12-13 DIAGNOSIS — E118 Type 2 diabetes mellitus with unspecified complications: Secondary | ICD-10-CM

## 2023-12-13 DIAGNOSIS — E1165 Type 2 diabetes mellitus with hyperglycemia: Secondary | ICD-10-CM

## 2023-12-13 DIAGNOSIS — I10 Essential (primary) hypertension: Secondary | ICD-10-CM

## 2023-12-13 LAB — POCT GLYCOSYLATED HEMOGLOBIN (HGB A1C): Hemoglobin A1C: 6.3 % — AB (ref 4.0–5.6)

## 2023-12-13 MED ORDER — XIGDUO XR 10-500 MG PO TB24
1.0000 | ORAL_TABLET | Freq: Every day | ORAL | 1 refills | Status: DC
Start: 2023-12-13 — End: 2024-09-01

## 2023-12-13 MED ORDER — TIRZEPATIDE 12.5 MG/0.5ML ~~LOC~~ SOAJ
12.5000 mg | SUBCUTANEOUS | 1 refills | Status: DC
Start: 2023-12-13 — End: 2024-02-19

## 2023-12-13 NOTE — Assessment & Plan Note (Signed)
 A1c looks great at 6.3 which is fantastic great improvement from 11.2.  So far the combination of the Mounjaro  10 mg and the Xigduo  together are working really well he is also tried to work on his diet and is doing better with portioning.  Continue to work on getting adequate protein and vegetables and and less carbs.  Follow-up in 4 months.  He does note that he is now switched to Medicare and so will need an authorization for his 2 medications.

## 2023-12-13 NOTE — Progress Notes (Signed)
   Established Patient Office Visit  Subjective  Patient ID: Buckley C. Braver, male    DOB: 28-Dec-1955  Age: 68 y.o. MRN: [id number]  No chief complaint on file.   HPI 4 mo f/u     ROS    Objective:     BP 120/82   Pulse 79   Ht 5' 8 (1.727 m)   Wt 200 lb (90.7 kg)   SpO2 98%   BMI 30.41 kg/m    Physical Exam Vitals and nursing note reviewed.  Constitutional:      Appearance: Normal appearance.  HENT:     Head: Normocephalic and atraumatic.  Eyes:     Conjunctiva/sclera: Conjunctivae normal.  Cardiovascular:     Rate and Rhythm: Normal rate and regular rhythm.  Pulmonary:     Effort: Pulmonary effort is normal.     Breath sounds: Normal breath sounds.  Skin:    General: Skin is warm and dry.  Neurological:     Mental Status: He is alert.  Psychiatric:        Mood and Affect: Mood normal.      Results for orders placed or performed in visit on 12/13/23  POCT HgB A1C  Result Value Ref Range   Hemoglobin A1C 6.3 (A) 4.0 - 5.6 %   HbA1c POC (<> result, manual entry)     HbA1c, POC (prediabetic range)     HbA1c, POC (controlled diabetic range)        The 10-year ASCVD risk score (Arnett DK, et al., 2019) is: 25.4%    Assessment & Plan:   Problem List Items Addressed This Visit       Cardiovascular and Mediastinum   HYPERTENSION, BENIGN   Blood pressure looks absolutely phenomenal today.  Continue current regimen.  Follow-up in 4 months.      Relevant Orders   CMP14+EGFR   Lipid panel   Urine Microalbumin w/creat. ratio     Endocrine   Controlled diabetes mellitus type 2 with complications (HCC) - Primary   A1c looks great at 6.3 which is fantastic great improvement from 11.2.  So far the combination of the Mounjaro  10 mg and the Xigduo  together are working really well he is also tried to work on his diet and is doing better with portioning.  Continue to work on getting adequate protein and vegetables and and less carbs.  Follow-up in 4  months.  He does note that he is now switched to Medicare and so will need an authorization for his 2 medications.      Relevant Medications   Dapagliflozin Pro-metFORMIN  ER (XIGDUO  XR) 10-500 MG TB24   tirzepatide  (MOUNJARO ) 12.5 MG/0.5ML Pen   Other Relevant Orders   POCT HgB A1C (Completed)   CMP14+EGFR   Lipid panel   Urine Microalbumin w/creat. ratio   Other Visit Diagnoses       Uncontrolled type 2 diabetes mellitus with hyperglycemia (HCC)       Relevant Medications   Dapagliflozin Pro-metFORMIN  ER (XIGDUO  XR) 10-500 MG TB24   tirzepatide  (MOUNJARO ) 12.5 MG/0.5ML Pen       Return in about 4 months (around 04/11/2024) for Diabetes follow-up, Hypertension.    Dorothyann Byars, MD

## 2023-12-13 NOTE — Assessment & Plan Note (Signed)
 Blood pressure looks absolutely phenomenal today.  Continue current regimen.  Follow-up in 4 months.

## 2023-12-13 NOTE — Telephone Encounter (Signed)
 Prescription for Xigduo  and Mounjaro  sent to pharmacy.  He is now on Medicare so switched insurances.  Will need prior authorization for both of these medications.

## 2023-12-14 ENCOUNTER — Telehealth: Payer: Self-pay

## 2023-12-14 LAB — MICROALBUMIN / CREATININE URINE RATIO
Creatinine, Urine: 471.4 mg/dL
Microalb/Creat Ratio: 10 mg/g{creat} (ref 0–29)
Microalbumin, Urine: 48 ug/mL

## 2023-12-14 LAB — CMP14+EGFR
ALT: 17 [IU]/L (ref 0–44)
AST: 18 [IU]/L (ref 0–40)
Albumin: 4.6 g/dL (ref 3.9–4.9)
Alkaline Phosphatase: 70 [IU]/L (ref 44–121)
BUN/Creatinine Ratio: 12 (ref 10–24)
BUN: 12 mg/dL (ref 8–27)
Bilirubin Total: 0.5 mg/dL (ref 0.0–1.2)
CO2: 25 mmol/L (ref 20–29)
Calcium: 10 mg/dL (ref 8.6–10.2)
Chloride: 101 mmol/L (ref 96–106)
Creatinine, Ser: 1.01 mg/dL (ref 0.76–1.27)
Globulin, Total: 2.3 g/dL (ref 1.5–4.5)
Glucose: 99 mg/dL (ref 70–99)
Potassium: 4.4 mmol/L (ref 3.5–5.2)
Sodium: 141 mmol/L (ref 134–144)
Total Protein: 6.9 g/dL (ref 6.0–8.5)
eGFR: 83 mL/min/{1.73_m2} (ref >59)

## 2023-12-14 LAB — LIPID PANEL
Chol/HDL Ratio: 3.7 {ratio} (ref 0.0–5.0)
Cholesterol, Total: 128 mg/dL (ref 100–199)
HDL: 35 mg/dL — ABNORMAL LOW (ref >39)
LDL Chol Calc (NIH): 72 mg/dL (ref 0–99)
Triglycerides: 114 mg/dL (ref 0–149)
VLDL Cholesterol Cal: 21 mg/dL (ref 5–40)

## 2023-12-14 NOTE — Telephone Encounter (Signed)
 Copied from CRM [id number]. Topic: Clinical - Prescription Issue >> Dec 14, 2023  1:01 PM Steven Estrada PARAS wrote: Reason for CRM: [employer] [facility] is calling yet again to request that the fax from 01/31 regarding [facility] be handled immediately. Requested I send another message for an immediate update and completion if possible. She states she has called multiple times and it has simply not been addressed.

## 2023-12-18 ENCOUNTER — Encounter: Payer: Self-pay | Admitting: Family Medicine

## 2023-12-18 ENCOUNTER — Other Ambulatory Visit: Payer: Self-pay | Admitting: Sports Medicine

## 2023-12-18 DIAGNOSIS — M25531 Pain in right wrist: Secondary | ICD-10-CM

## 2023-12-18 MED ORDER — MELOXICAM 15 MG PO TABS: ORAL_TABLET | ORAL | 3 refills | Status: AC

## 2023-12-18 NOTE — Progress Notes (Signed)
Hi Arne, LDL is under 100 so the Crestor is helping and working well.  B[place]ick panel looks great.  No excess protein in the urine which is great.

## 2023-12-19 ENCOUNTER — Telehealth: Payer: Self-pay | Admitting: Family Medicine

## 2023-12-19 NOTE — Telephone Encounter (Signed)
Copied from CRM [id number]. Topic: Clinical - Prescription Issue >> Dec 18, 2023  5:22 PM Kristie Cowman wrote: Reason for CRM: [facility] would like to have all of the patient's new/active prescriptions sent to them.  I spoke with Irving Burton from [facility].

## 2023-12-19 NOTE — Telephone Encounter (Signed)
Fax was completed and confirmation received

## 2023-12-19 NOTE — Telephone Encounter (Signed)
Fax was sent and confirmation received and scanned into pt's chart.

## 2023-12-19 NOTE — Telephone Encounter (Signed)
Prescription signed and was faxed yesterday on 12/18/2023

## 2023-12-31 ENCOUNTER — Telehealth: Payer: Self-pay

## 2023-12-31 NOTE — Telephone Encounter (Signed)
 Per provider's request - tried to obtain auths for [employer] and [employer]. There is no coverage active listed. Contacted the patient directly, he has Medicare A & B coverage (no supplementation). No Berkley Harvey is required. Patient is due to have his meds delivered. Patient is aware to contact the office if any issues with getting his Xigduo Xr and [employer]Life.

## 2024-01-01 NOTE — Telephone Encounter (Signed)
 Per provider's request - tried to obtain auths for [employer] and [employer]. There is no coverage active listed. Contacted the patient directly, he has Medicare A & B coverage (no supplementation). No Berkley Harvey is required. Patient is due to have his meds delivered. Patient is aware to contact the office if any issues with getting his Xigduo Xr and [employer]Life.

## 2024-02-19 ENCOUNTER — Telehealth: Payer: Self-pay

## 2024-02-19 DIAGNOSIS — E118 Type 2 diabetes mellitus with unspecified complications: Secondary | ICD-10-CM

## 2024-02-19 MED ORDER — TRULICITY 4.5 MG/0.5ML ~~LOC~~ SOAJ: 4.5000 mg | SUBCUTANEOUS | 3 refills | Status: DC

## 2024-02-19 NOTE — Telephone Encounter (Signed)
 Okay, will switch to Trulicity.  New prescription sent.  Sorry, [facility] was already preselected so that is where I had sent the original prescription will have to call and cancel it I resent to [facility] as below.

## 2024-02-19 NOTE — Telephone Encounter (Signed)
 Copied from CRM [id number]. Topic: Clinical - Prescription Issue >> Feb 19, 2024  2:24 PM Tisa Forester wrote: Reason for CRM: patient insurance medicare  will not pay for the tirzepatide (MOUNJARO) 12.5 MG/0.5ML Pen patient want to know if Dr. Duaine German will put patient on the Trulicity instead , patient is out of the medication   [facility] [place] [address] [place] [address] Phone: [phone] Fax: [phone] Hours: Not open 24 hours

## 2024-03-28 ENCOUNTER — Other Ambulatory Visit: Payer: Self-pay

## 2024-03-28 ENCOUNTER — Telehealth: Payer: Self-pay | Admitting: Family Medicine

## 2024-03-28 DIAGNOSIS — I10 Essential (primary) hypertension: Secondary | ICD-10-CM

## 2024-03-28 DIAGNOSIS — E118 Type 2 diabetes mellitus with unspecified complications: Secondary | ICD-10-CM

## 2024-03-28 NOTE — Telephone Encounter (Signed)
 Copied from CRM [id number]. Topic: Clinical - Medication Refill >> Mar 28, 2024  1:08 PM Brynn Caras wrote: Medication:  Dulaglutide  (TRULICITY ) 4.5 MG/0.5ML SOAJ metoprolol  tartrate (LOPRESSOR ) 25 MG tablet  Has the patient contacted their pharmacy? Yes (Agent: If no, request that the patient contact the pharmacy for the refill. If patient does not wish to contact the pharmacy document the reason why and proceed with request.) (Agent: If yes, when and what did the pharmacy advise?)  This is the patient's preferred pharmacy:   [facility] [place] [address] [place] [address] Phone: [phone] Fax: [phone]  Is this the correct pharmacy for this prescription? Yes If no, delete pharmacy and type the correct one.   Has the prescription been filled recently? No  Is the patient out of the medication? Yes  Has the patient been seen for an appointment in the last year OR does the patient have an upcoming appointment? Yes  Can we respond through MyChart? Yes  Agent: Please be advised that Rx refills may take up to 3 business days. We ask that you follow-up with your pharmacy.

## 2024-04-03 ENCOUNTER — Other Ambulatory Visit: Payer: Self-pay | Admitting: Sports Medicine

## 2024-04-03 DIAGNOSIS — M25531 Pain in right wrist: Secondary | ICD-10-CM

## 2024-04-04 ENCOUNTER — Other Ambulatory Visit (HOSPITAL_COMMUNITY): Payer: Self-pay

## 2024-04-04 ENCOUNTER — Telehealth: Payer: Self-pay

## 2024-04-04 NOTE — Telephone Encounter (Signed)
 Pharmacy Patient Advocate Encounter   Received notification from Patient Pharmacy that prior authorization for Trulicity  4.5mg /0.21ml is required/requested.   Insurance verification completed.   The patient is insured through [facility] .   Per test claim: PA required; PA submitted to above mentioned insurance via CoverMyMeds Key/confirmation #/EOC [id number] Status is pending

## 2024-04-04 NOTE — Telephone Encounter (Signed)
 Pharmacy Patient Advocate Encounter  Received notification from [employer] that Prior Authorization for Trulicity  4.5mg /0.27ml has been APPROVED from 04/04/24 to 11/05/24   PA #/Case ID/Reference #: [id number]  Spoke with Mylinda Asa at [facility] pharmacy to notify of the approval and they will get it ready to send out to the patient.

## 2024-04-14 ENCOUNTER — Ambulatory Visit: Payer: Self-pay | Admitting: Family Medicine

## 2024-05-13 ENCOUNTER — Telehealth: Payer: Self-pay

## 2024-05-13 NOTE — Telephone Encounter (Signed)
 Copied from CRM [id number]. Topic: Clinical - Medication Prior Auth >> May 13, 2024  3:27 PM Suzette B wrote: Reason for CRM: Dulaglutide  (TRULICITY ) 4.5 MG/0.5ML SOAJ, patient states that he called the pharmacy regarding his medication listed above due to his insurance change, the pharmacy advised the patient that he was needing a prior authorization for his medication. He stated the pharmacy advised him they'd sent the request over, but he needed to speak with someone to verify the information please call the patient at [phone]

## 2024-05-13 NOTE — Telephone Encounter (Signed)
 A new PA is required due to insurance change for Trulicity . Thanks in advance.

## 2024-05-14 ENCOUNTER — Other Ambulatory Visit (HOSPITAL_COMMUNITY): Payer: Self-pay

## 2024-05-14 ENCOUNTER — Telehealth: Payer: Self-pay

## 2024-05-14 NOTE — Telephone Encounter (Signed)
 Pharmacy Patient Advocate Encounter   Received notification from Pt Calls Messages that prior authorization for Trulicity  4.5MG /0.5ML auto-injectors  is required/requested.   Insurance verification completed.   The patient is insured through [facility] .   Per test claim: PA required; PA submitted to above mentioned insurance via CoverMyMeds Key/confirmation #/EOC [id number] Status is pending

## 2024-05-14 NOTE — Telephone Encounter (Signed)
 PA request has been Submitted. New Encounter has been or will be created for follow up. For additional info see Pharmacy Prior Auth telephone encounter from 05/14/24.

## 2024-05-14 NOTE — Telephone Encounter (Signed)
 Pharmacy Patient Advocate Encounter  Received notification from [employer] that Prior Authorization for  Trulicity  4.5MG /0.5ML auto-injectors  has been APPROVED from 05/14/24 to 11/05/24. Ran test claim, Copay is $47. This test claim was processed through [facility]- copay amounts may vary at other pharmacies due to pharmacy/plan contracts, or as the patient moves through the different stages of their insurance plan.   PA #/Case ID/Reference #: [id number]

## 2024-05-16 ENCOUNTER — Other Ambulatory Visit: Payer: Self-pay | Admitting: Family Medicine

## 2024-05-16 DIAGNOSIS — I10 Essential (primary) hypertension: Secondary | ICD-10-CM

## 2024-05-26 ENCOUNTER — Encounter: Payer: Self-pay | Admitting: Family Medicine

## 2024-05-26 ENCOUNTER — Ambulatory Visit (INDEPENDENT_AMBULATORY_CARE_PROVIDER_SITE_OTHER): Payer: Self-pay | Admitting: Family Medicine

## 2024-05-26 VITALS — BP 118/79 | HR 103 | Ht 68.0 in | Wt 200.0 lb

## 2024-05-26 DIAGNOSIS — E118 Type 2 diabetes mellitus with unspecified complications: Secondary | ICD-10-CM | POA: Diagnosis not present

## 2024-05-26 DIAGNOSIS — R21 Rash and other nonspecific skin eruption: Secondary | ICD-10-CM

## 2024-05-26 DIAGNOSIS — I10 Essential (primary) hypertension: Secondary | ICD-10-CM

## 2024-05-26 DIAGNOSIS — E119 Type 2 diabetes mellitus without complications: Secondary | ICD-10-CM

## 2024-05-26 DIAGNOSIS — Z7985 Long-term (current) use of injectable non-insulin antidiabetic drugs: Secondary | ICD-10-CM

## 2024-05-26 DIAGNOSIS — E7800 Pure hypercholesterolemia, unspecified: Secondary | ICD-10-CM

## 2024-05-26 LAB — POCT GLYCOSYLATED HEMOGLOBIN (HGB A1C): Hemoglobin A1C: 7.3 % — AB (ref 4.0–5.6)

## 2024-05-26 MED ORDER — TRIAMCINOLONE ACETONIDE 0.5 % EX OINT: 1.0000 | TOPICAL_OINTMENT | Freq: Two times a day (BID) | CUTANEOUS | 0 refills | Status: DC

## 2024-05-26 MED ORDER — ROSUVASTATIN CALCIUM 40 MG PO TABS: 40.0000 mg | ORAL_TABLET | Freq: Every day | ORAL | 3 refills | Status: AC

## 2024-05-26 NOTE — Assessment & Plan Note (Signed)
 Well controlled. Continue current regimen. Follow up in  6 mo

## 2024-05-26 NOTE — Assessment & Plan Note (Signed)
 A1c is up at 7.3 today he said he was out of his Trulicity  for about a month.  But he is back on it.  He is also been out of his Xigduo  for about 3 or 4 days but the pharmacy just had it on backorder and they are getting it in so he should be able to pick it up.  He feels like he is doing okay with his diet.

## 2024-05-26 NOTE — Progress Notes (Signed)
 Established Patient Office Visit  Subjective  Patient ID: Steven Estrada, male    DOB: 04-Dec-1955  Age: 68 y.o. MRN: [id number]  Chief Complaint  Patient presents with   Diabetes   Hypertension    HPI  Diabetes - no hypoglycemic events. No wounds or sores that are not healing well. No increased thirst or urination. Checking glucose at home. Taking medications as prescribed without any side effects.  Says he does avoid sugary beverages he tries to put a lot of focus on vegetables and avoiding highly processed foods.  He plans on scheduling his eye exam.  Hypertension- Pt denies chest pain, SOB, dizziness, or heart palpitations.  Taking meds as directed w/o problems.  Denies medication side effects.    Also has a rash on his left upper shoulder blade that he would like me to take a look at today he says it has been itchy and a little bit burning it has been there for about a month.  He says he is worried about shingles.  He is also worried that the rash could be from his cholesterol medication he read online that it could cause a rash.  He is on Crestor  40 mg.  Struggling some with his back spasms he usually just uses heat and his stretches when it happens and tries to lay down and that does usually help he does have a prescription for cyclobenzaprine  as well.  He     ROS    Objective:     BP 118/79   Pulse (!) 103   Ht 5' 8 (1.727 m)   Wt 200 lb 0.6 oz (90.7 kg)   SpO2 97%   BMI 30.42 kg/m    Physical Exam Vitals and nursing note reviewed.  Constitutional:      Appearance: Normal appearance.  HENT:     Head: Normocephalic and atraumatic.  Eyes:     Conjunctiva/sclera: Conjunctivae normal.  Cardiovascular:     Rate and Rhythm: Normal rate and regular rhythm.  Pulmonary:     Effort: Pulmonary effort is normal.     Breath sounds: Normal breath sounds.  Skin:    General: Skin is warm and dry.     Comments: In his right upper back he has a area that is excoriated  and a most linear lesion on his right upper back with some scabbing.  Neurological:     Mental Status: He is alert.  Psychiatric:        Mood and Affect: Mood normal.      Results for orders placed or performed in visit on 05/26/24  POCT HgB A1C  Result Value Ref Range   Hemoglobin A1C 7.3 (A) 4.0 - 5.6 %   HbA1c POC (<> result, manual entry)     HbA1c, POC (prediabetic range)     HbA1c, POC (controlled diabetic range)        The ASCVD Risk score (Arnett DK, et al., 2019) failed to calculate for the following reasons:   The valid total cholesterol range is 130 to 320 mg/dL    Assessment & Plan:   Problem List Items Addressed This Visit       Cardiovascular and Mediastinum   HYPERTENSION, BENIGN   Well controlled. Continue current regimen. Follow up in  71mo       Relevant Medications   rosuvastatin  (CRESTOR ) 40 MG tablet     Endocrine   Controlled diabetes mellitus type 2 with complications (HCC) - Primary   A1c  is up at 7.3 today he said he was out of his Trulicity  for about a month.  But he is back on it.  He is also been out of his Xigduo  for about 3 or 4 days but the pharmacy just had it on backorder and they are getting it in so he should be able to pick it up.  He feels like he is doing okay with his diet.      Relevant Medications   rosuvastatin  (CRESTOR ) 40 MG tablet   Other Relevant Orders   POCT HgB A1C (Completed)     Other   Hyperlipidemia   Relevant Medications   rosuvastatin  (CRESTOR ) 40 MG tablet   Other Visit Diagnoses       Rash       Relevant Medications   triamcinolone  ointment (KENALOG ) 0.5 %     Diabetes mellitus without complication (HCC)       Relevant Medications   rosuvastatin  (CRESTOR ) 40 MG tablet       Return in about 3 months (around 09/02/2024) for Hypertension, Diabetes follow-up.    Dorothyann Byars, MD

## 2024-07-02 ENCOUNTER — Telehealth: Payer: Self-pay

## 2024-07-02 NOTE — Telephone Encounter (Signed)
 I called and left message about a recent diabetic eye exam. We need to know the provider and location of eye exam.

## 2024-07-08 ENCOUNTER — Encounter: Payer: Self-pay | Admitting: Sports Medicine

## 2024-07-17 NOTE — Discharge Summary (Signed)
 Discharge Summary - [facility]   Steven Estrada is a 68 y.o.  male. DOB: November 08, 1955  MRN: [id number]  Attending Physician: Hezzie Feeling, MD PCP: [place] State PCP, [place]  Transition of Care   Admit Date: 07/16/2024          D/C Date: 07/17/2024           Patient Class: Inpatient    Primary Discharge Diagnosis:  Likely Type II MI Chest pain  - resolved        Discharge Medication List     CONTINUE taking these medications    aspirin  EC 81 mg Tbec Take 1 Tab by Mouth Once a Day. Refills: 0    ezetimibe 10 mg Tabs Take 1 Tab by Mouth Once a Day. Refills: 0 Commonly known as: Zetia    fish oil 500 mg Caps Take 1 Cap by Mouth Once a Day. Refills: 0    meloxicam  7.5 mg Tabs Take 10 mg by Mouth Once a Day. Refills: 0 Commonly known as: Mobic     metoprolol  XL 50 mg Tb24 Take 1 Tab by Mouth Once a Day. Refills: 0 Commonly known as: Toprol  XL    rosuvastatin  40 mg Tabs Take 1 Tab by Mouth Every Night at Bedtime. Refills: 0 Commonly known as: Crestor     tadalafiL  20 mg Tabs Take 1 Tab by Mouth Daily as needed for Other. Refills: 0 Commonly known as: Cialis     triamcinolone  acetonide 0.5 % Crea Use 1 Inch to affected area Twice Daily. Refills: 0 Commonly known as: KeNAlog     Tribenzor 40-10-25 mg Tabs Take 1 Tab by Mouth Once a Day. Refills: 0 Generic drug: Olmesartan -amLODIPine -HCTZ    Trulicity  4.5 mg/0.5 mL Pen injector Inject 0.5 mL beneath the skin Every 7 Days. Refills: 0 Generic drug: dulaglutide     Xigduo  XR 10-500 mg Tbph Take 1 Tab by Mouth Once a Day. Refills: 0 Generic drug: dapagliflozin-metFORMIN           Hospital Course   Chief Complaint/History of Present Illness:  Chest pain  Problems Managed During Hospitalization: Likely Type II MI Chest pain  - resolved DM2  Anemia    Hospital Course: Per admitting, 68 year old male with a past medical history of DM2, hypertension, hyperlipidemia presenting to the emergency  room with substernal left-sided chest pain that began earlier in the evening.  Chest pain resolved following high dose ASA and sublingual nitrate provided by EMS.  At bedside patient denies further complaints.  EKG without STEMI criteria.  Troponin notably uptrending on repeat testing and concerning for NSTEMI.  Heparin  GTT initiated per ACS and patient admitted for further evaluation and management. Seen by cardiology, 2D echo is normal.  No further inpatient workup needed, patient symptoms resolved.  He will follow up with outpatient cardio.    Likely Type II MI Chest pain - Chest x-ray with vascular congestion but negative for infection or pneumothorax -Aspirin  and statin therapy - Heparin  GTT -Beta-blocker as hemodynamics allow - Echocardiogram normal study - Cardiology added treatment team, follow recs - N.p.o. - Trending troponin -Telemetry   DM2 - SSI with Accu-Cheks   Anemia - Serial labs          Active Hospital Problems   Diagnosis  . Precordial pain [R07.2]  . Chest pain [R07.9]  . NSTEMI (non-ST elevated myocardial infarction) [facility]) [I21.4]    Resolved Hospital Problems  No resolved problems to display.  Discharge Status   Physical Exam Vitals and nursing note reviewed.  Constitutional:      Appearance: Normal appearance.  HENT:     Head: Normocephalic and atraumatic.     Right Ear: External ear normal.     Left Ear: External ear normal.     Nose: Nose normal.     Mouth/Throat:     Mouth: Mucous membranes are moist.     Pharynx: Oropharynx is clear.  Eyes:     Extraocular Movements: Extraocular movements intact.     Conjunctiva/sclera: Conjunctivae normal.     Pupils: Pupils are equal, round, and reactive to light.  Cardiovascular:     Rate and Rhythm: Normal rate and regular rhythm.  Pulmonary:     Effort: Pulmonary effort is normal.     Breath sounds: Normal breath sounds.  Abdominal:     General: Abdomen is flat.  Bowel sounds are normal.     Palpations: Abdomen is soft.  Musculoskeletal:        General: Normal range of motion.     Cervical back: Normal range of motion and neck supple.  Skin:    General: Skin is warm and dry.     Capillary Refill: Capillary refill takes less than 2 seconds.  Neurological:     General: No focal deficit present.     Mental Status: He is alert and oriented to person, place, and time.  Psychiatric:        Mood and Affect: Mood normal.        Behavior: Behavior normal.     BP: 121/72  Heart Rate: 74 (07/17/24 0706)  Pulse: 71 (07/17/24 1024)  Temp: 98.4 F (36.9 C)  Resp: 18  Height: 5' 8 (172.7 cm)  Weight: 90.7 kg (199 lb 15.3 oz)  BMI (Calculated): 30.41  SpO2: 97 %     Temp (24hrs), Avg:98.2 F (36.8 C), Min:97.7 F (36.5 C), Max:98.4 F (36.9 C)    Patient status at discharge: stable and improved Disposition of patient at discharge: home     Discharge Labs  All labs in past 24hrs:  Results for orders placed or performed during the hospital encounter of 07/16/24 (from the past 24 hours)  BASIC METABOLIC PANEL  Result Value Ref Range   Potassium 4.2 3.5 - 5.5 mmol/L   Sodium 135 133 - 145 mmol/L   Chloride 99 98 - 110 mmol/L   Glucose 361 (H) 70 - 99 mg/dL   Calcium  9.4 8.4 - 10.5 mg/dL   BUN 17 6 - 22 mg/dL   Creatinine 0.9 0.8 - 1.6 mg/dL   CO2 23 20 - 32 mmol/L   eGFR >90.0 >60.0 mL/min/1.73 sq.m.   Anion Gap 13.0 3.0 - 15.0 mmol/L  CBC WITH DIFFERENTIAL AUTO  Result Value Ref Range   WBC 5.9 4.0 - 11.0 K/uL   RBC 4.43 3.80 - 5.80 M/uL   HGB 12.1 (L) 12.6 - 17.1 g/dL   HCT 63.0 (L) 62.1 - 47.7 %   MCV 83 80 - 95 fL   MCH 27 26 - 34 pg   MCHC 33 31 - 36 g/dL   RDW 84.8 89.9 - 84.4 %   Platelet 245 140 - 440 K/uL   MPV 10.7 9.0 - 13.0 fL   Segmented Neutrophils (Auto) 51 40 - 75 %   Lymphocytes (Auto) 38 20 - 45 %   Monocytes (Auto) 9 3 - 12 %   Eosinophils (Auto) 2 0 - 6 %  Basophils (Auto) 1 0 - 2 %   Absolute  Neutrophils (Auto) 3.0 1.8 - 7.7 K/uL   Absolute Lymphocytes (Auto) 2.2 1.0 - 4.8 K/uL   Absolute Monocytes (Auto) 0.5 0.1 - 1.0 K/uL   Absolute Eosinophils (Auto) 0.1 0.0 - 0.5 K/uL   Absolute Basophils (Auto) 0.0 0.0 - 0.2 K/uL  Troponin Care Path  Result Value Ref Range   Troponin (T) Quant High Sensitivity (5th Gen) 15 0 - 19 ng/L  Troponin 1 HR  Result Value Ref Range   Troponin (T) Quant High Sensitivity (5th Gen) 26 (H) 0 - 19 ng/L  Troponin q3h x2  Result Value Ref Range   Troponin (T) Quant High Sensitivity (5th Gen) 31 (H) 0 - 19 ng/L  Lipid Complete Panel now  Result Value Ref Range   Cholesterol 143 110 - 200 mg/dL   Triglyceride 825 (H) 40 - 149 mg/dL   HDL 34 (L) >=59 mg/dL   Cholesterol/HDL 4.2 0.0 - 5.0   Non-HDL Cholesterol 109 <130 mg/dL   LDL CALCULATION 74 50 - 99 mg/dL   VLDL CALCULATION 35 (H) 8 - 30 mg/dL   LDL/HDL Ratio 2.2   Troponin q3h x2  Result Value Ref Range   Troponin (T) Quant High Sensitivity (5th Gen) 28 (H) 0 - 19 ng/L  GLUCOSE SCREEN  Result Value Ref Range   Glucose POC 181 (H) 70 - 99 mg/dL  Activated Factor Xa, UFH  Result Value Ref Range   Heparin  Anti-Xa Unfractionated 0.26 (L) 0.30 - 0.70 IU/mL  GLUCOSE SCREEN  Result Value Ref Range   Glucose POC 182 (H) 70 - 99 mg/dL    Patient Instructions   No discharge procedures on file.  Follow Up Info (next 45 days)     Follow up with Pcp, [place], MD        Documentation   Total Time Coordinating Discharge (minutes) : 40   Hezzie Feeling, MD 07/17/2024 2:30 PM

## 2024-07-21 ENCOUNTER — Ambulatory Visit (INDEPENDENT_AMBULATORY_CARE_PROVIDER_SITE_OTHER): Payer: Self-pay | Admitting: Family Medicine

## 2024-07-21 ENCOUNTER — Encounter: Payer: Self-pay | Admitting: Family Medicine

## 2024-07-21 VITALS — BP 120/72 | HR 92 | Ht 68.0 in | Wt 197.0 lb

## 2024-07-21 DIAGNOSIS — R1314 Dysphagia, pharyngoesophageal phase: Secondary | ICD-10-CM

## 2024-07-21 DIAGNOSIS — E118 Type 2 diabetes mellitus with unspecified complications: Secondary | ICD-10-CM

## 2024-07-21 DIAGNOSIS — K2100 Gastro-esophageal reflux disease with esophagitis, without bleeding: Secondary | ICD-10-CM

## 2024-07-21 DIAGNOSIS — Z7985 Long-term (current) use of injectable non-insulin antidiabetic drugs: Secondary | ICD-10-CM

## 2024-07-21 DIAGNOSIS — R0789 Other chest pain: Secondary | ICD-10-CM

## 2024-07-21 DIAGNOSIS — K5900 Constipation, unspecified: Secondary | ICD-10-CM

## 2024-07-21 MED ORDER — PANTOPRAZOLE SODIUM 20 MG PO TBEC: 20.0000 mg | DELAYED_RELEASE_TABLET | Freq: Two times a day (BID) | ORAL | 2 refills | Status: DC

## 2024-07-21 NOTE — Patient Instructions (Signed)
 Please start checking your sugars daily in the morning. If not under 150 please let me know so we can adjust your medication.   Start miralax - 1 capful mixed with 6 oz of water daily at bedtime until soft bowel movement

## 2024-07-21 NOTE — Progress Notes (Signed)
 Pt reports that he has been experiencing indigestion for the past 1.5 week.

## 2024-07-21 NOTE — Progress Notes (Signed)
 Established Patient Office Visit  Subjective  Patient ID: Steven Estrada, male    DOB: 02/16/1956  Age: 68 y.o. MRN: [id number]  Chief Complaint  Patient presents with   Follow-up    HPI  Discussed the use of AI scribe software for clinical note transcription with the patient, who gave verbal consent to proceed.  History of Present Illness Steven Estrada is a 68 year old male who presents with chest pain and difficulty swallowing.  Chest pain and discomfort - Chest pain and discomfort present for approximately 6-7 days - Initially attributed to indigestion and heartburn - Pain intensified and spread across the chest - Received emergency care and was treated with nitroglycerin  - Cardiac etiology ruled out by subsequent testing  Dysphagia and esophageal symptoms - Difficulty swallowing for 3-4 days - Sensation of food getting 'hung' and slowly passing down the esophagus - No regurgitation of food - Increased bloating and belching - No prescribed medications taken for these symptoms - Uses over-the-counter antacids such as Tums and Rolaids  Constipation, new - Constipation ongoing for approximately 3 weeks - Uses stool softeners such as Colace with persistent symptoms - Continues to feel backed up - No recent changes in diet or new supplements  Nutritional intake - Has not eaten today due to symptoms - Typically eats regularly - Onset of decreased oral intake coincides with onset of chest pain and dysphagia, approximately 7-8 days ago      ROS    Objective:     BP 120/72   Pulse 92   Ht 5' 8 (1.727 m)   Wt 197 lb 0.6 oz (89.4 kg)   SpO2 98%   BMI 29.96 kg/m    Physical Exam Vitals and nursing note reviewed.  Constitutional:      Appearance: Normal appearance.  HENT:     Head: Normocephalic and atraumatic.  Eyes:     Conjunctiva/sclera: Conjunctivae normal.  Cardiovascular:     Rate and Rhythm: Normal rate and regular rhythm.  Pulmonary:      Effort: Pulmonary effort is normal.     Breath sounds: Normal breath sounds.  Abdominal:     General: Bowel sounds are normal. There is no distension.     Palpations: Abdomen is soft. There is no mass.  Skin:    General: Skin is warm and dry.  Neurological:     Mental Status: He is alert.  Psychiatric:        Mood and Affect: Mood normal.      No results found for any visits on 07/21/24.    The ASCVD Risk score (Arnett DK, et al., 2019) failed to calculate for the following reasons:   Risk score cannot be calculated because patient has a medical history suggesting prior/existing ASCVD    Assessment & Plan:   Problem List Items Addressed This Visit       Endocrine   Controlled diabetes mellitus type 2 with complications (HCC)   Glucose was over 300 in the emergency room some go to have him start checking his blood sugars daily while at home this could be aggravating his symptoms.      Relevant Orders   H. pylori breath test   TSH   Other Visit Diagnoses       Pharyngoesophageal dysphagia    -  Primary   Relevant Orders   H. pylori breath test   TSH     Gastroesophageal reflux disease with esophagitis without hemorrhage  Relevant Orders   H. pylori breath test   TSH     Constipation, unspecified constipation type       Relevant Orders   H. pylori breath test   TSH     Atypical chest pain       Relevant Orders   H. pylori breath test   TSH      Assessment and Plan Assessment & Plan Gastroesophageal reflux disease with esophagitis and dysphagia Indigestion, dysphagia, and chest pain suggest acid reflux. Negative cardiac workup. Differential includes H. pylori, esophageal stricture, or acid irritation. - Perform H. pylori breath test today. - Initiate PPI treatment based on insurance coverage. - Advise PPI intake 15-20 minutes before breakfast or at bedtime. - Discuss dietary modifications: avoid spicy, greasy, acidic foods, chocolate, caffeine,  carbonated beverages. - Consider GI referral for possible esophageal stricture, if H. pylori negative or symptoms persist.   Constipation Constipation for three weeks with limited relief from stool softeners. Discussed thyroid involvement. Constipation may worsen reflux by increasing abdominal pressure. - Check thyroid function. - Recommend Miralax as an osmotic laxative, mix with water, take at bedtime for 2-3 nights until looser stool.    Return in about 2 months (around 09/20/2024) for Diabetes follow-up.    Dorothyann Byars, MD

## 2024-07-21 NOTE — Assessment & Plan Note (Signed)
 Glucose was over 300 in the emergency room some go to have him start checking his blood sugars daily while at home this could be aggravating his symptoms.

## 2024-07-23 ENCOUNTER — Ambulatory Visit: Payer: Self-pay | Admitting: Family Medicine

## 2024-07-23 LAB — TSH: TSH: 0.934 u[IU]/mL (ref 0.450–4.500)

## 2024-07-23 LAB — H. PYLORI BREATH TEST

## 2024-07-23 NOTE — Progress Notes (Signed)
 Hi Steven Estrada, your breath test came back negative for the H. pylori bacteria which is great news and your thyroid level looked okay.  See if the pantoprazole  helps with your symptoms.  If you are still having the issue with food feeling like it is getting hung or stuck or slowing down through the esophagus in the next week or 2 then let me know and I am going to go ahead and refer you to GI to have them take a look at your esophagus.  If you are feeling tremendously better then we will continue to monitor.

## 2024-08-10 ENCOUNTER — Other Ambulatory Visit: Payer: Self-pay | Admitting: Family Medicine

## 2024-08-10 DIAGNOSIS — I10 Essential (primary) hypertension: Secondary | ICD-10-CM

## 2024-08-25 ENCOUNTER — Ambulatory Visit: Admitting: Family Medicine

## 2024-08-29 ENCOUNTER — Telehealth: Payer: Self-pay

## 2024-08-29 ENCOUNTER — Other Ambulatory Visit: Payer: Self-pay

## 2024-08-29 NOTE — Telephone Encounter (Signed)
 We dont' have any samples do we?

## 2024-08-29 NOTE — Telephone Encounter (Signed)
 Copied from CRM [id number]. Topic: Clinical - Prescription Issue >> Aug 29, 2024 11:22 AM Emylou G wrote: Reason for CRM: Patient called.. said he doesn't have ins right now, but will mid November.. Is there a alternative we can use for : Dapagliflozin Pro-metFORMIN  ER (XIGDUO  XR) 10-500 MG TB24 . Dulaglutide  (TRULICITY ) 4.5 MG/0.5ML SOAJ - so it can be affordable for now.. [facility] [address] is his pharmacy.

## 2024-08-29 NOTE — Telephone Encounter (Signed)
 Copied from CRM [id number]. Topic: Clinical - Prescription Issue >> Aug 29, 2024 11:22 AM Emylou G wrote: Reason for CRM: Patient called.. said he doesn't have ins right now, but will mid November.. Is there a alternative we can use for : Dapagliflozin Pro-metFORMIN  ER (XIGDUO  XR) 10-500 MG TB24 . Dulaglutide  (TRULICITY ) 4.5 MG/0.5ML SOAJ - so it can be affordable for now.. [facility] [address] is his pharmacy. >> Aug 29, 2024  3:43 PM Dedra B wrote: Patient called to follow up on request to have his Dapagliflozin Pro-metFORMIN  ER (XIGDUO  XR) 10-500 MG TB24 and Dulaglutide  (TRULICITY ) 4.5 MG/0.5ML SOAJ switched to more affordable alternatives while he's in between insurances.

## 2024-09-01 ENCOUNTER — Ambulatory Visit: Payer: Self-pay

## 2024-09-01 MED ORDER — METFORMIN HCL ER 500 MG PO TB24: 500.0000 mg | ORAL_TABLET | Freq: Two times a day (BID) | ORAL | 1 refills | Status: AC

## 2024-09-01 NOTE — Telephone Encounter (Signed)
 Sample of starter dose Mounjaro  and metformin  sent to pharmacy.  Just make sure working on diet really well.

## 2024-09-01 NOTE — Telephone Encounter (Signed)
 We do not have samples of xigduo  or trulicity .   Per Dr. Alvan can use sample of Mounjaro  2.5mg    and will change xigduo  to metformin  until new insurance will kick in in [facility] November.  Attempted call to patient . Left a voice mail message requesting a return call.   Pended metformin  XR 500mg  prescription

## 2024-09-01 NOTE — Telephone Encounter (Signed)
 FYI Only or Action Required?: Action required by provider: Alternative medications.  Patient was last seen in primary care on 07/21/2024 by Alvan Dorothyann BIRCH, MD.  Called Nurse Triage reporting Medication Problem.   Triage Disposition: Call PCP Now  Patient/caregiver understands and will follow disposition?: Yes     Message from Anairis L sent at 09/01/2024 11:29 AM EDT  Reason for Triage: 08/29/2024 Reason for CRM: Patient called.. said he doesn't have ins right now, but will mid November.. Is there a alternative we can use for : Dapagliflozin Pro-metFORMIN  ER (XIGDUO  XR) 10-500 MG TB24 . Dulaglutide  (TRULICITY ) 4.5 MG/0.5ML SOAJ - so it can be affordable for now.. [facility] [address] is [facility] pharmacy.   Still no response from clinic   Reason for Disposition  [1] Caller has URGENT medicine question about med that primary care doctor (or NP/PA) or specialist prescribed AND [2] triager unable to answer question  Answer Assessment - Initial Assessment Questions Patient is requesting alternative medication to take until his insurance is active. He would like medication sent to pharmacy below. Requesting a call back regarding his request.   [id number] - [address] [place] - [address]  [address] [place], [place] [address]      1. NAME of MEDICINE: What medicine(s) are you calling about?     Dapagliflozin Pro-metFORMIN  ER (XIGDUO  XR) 10-500 MG TB24 and Dulaglutide  (TRULICITY ) 4.5 MG/0.5ML SOAJ 2. QUESTION: What is your question? (e.g., double dose of medicine, side effect)     Are there alternative medications  3. PRESCRIBER: Who prescribed the medicine? Reason: if prescribed by specialist, call should be referred to that group.     PCP 4. SYMPTOMS: Do you have any symptoms? If Yes, ask: What symptoms are you having?  How bad are the symptoms (e.g., mild, moderate, severe)     Denies  Protocols used: Medication Question Call-A-AH

## 2024-09-02 NOTE — Telephone Encounter (Signed)
 Again attempted call to patient. Again left a voice mail message requesting a return call.

## 2024-09-02 NOTE — Telephone Encounter (Signed)
 Attempted call to patient. Left a voice mail message requesting a return call.

## 2024-09-10 ENCOUNTER — Telehealth: Payer: Self-pay

## 2024-09-10 NOTE — Telephone Encounter (Signed)
Attempted call to patient. Again left a voice mail message requesting a return call.

## 2024-09-10 NOTE — Telephone Encounter (Signed)
 Copied from CRM [id number]. Topic: Clinical - Medication Question >> Sep 10, 2024  3:49 PM Anairis L wrote: Reason for CRM: Patient is requesting a medication to go with metformin  that is no to pricey.

## 2024-09-10 NOTE — Telephone Encounter (Signed)
 Copied from CRM [id number]. Topic: Clinical - Medication Question >> Sep 10, 2024 11:55 AM Rosaria BRAVO wrote: Reason for CRM: Pt wants to know if he can receive anything stronger than metformin ? Wants to speak to the clinic  Best contact: [phone]

## 2024-09-10 NOTE — Telephone Encounter (Signed)
 Attempted call to patient. Left a voice mail message requesting a return call.

## 2024-09-11 NOTE — Telephone Encounter (Signed)
 Spoke with patient - informed that can pickup the Mounjaro  2.5mg  sample here in the office .  He will do so today.

## 2024-09-11 NOTE — Telephone Encounter (Signed)
 Patient came into office to get 1 sample of Mounjaro , logged into the sample binder in the lab, thanks.

## 2024-09-11 NOTE — Telephone Encounter (Signed)
 Spoke with patient. Informed that mounjaro  2.5mg  sample is at office for pick up to tide him over until the new insurance is started.

## 2024-09-22 ENCOUNTER — Ambulatory Visit: Payer: Self-pay | Admitting: Family Medicine

## 2024-10-20 ENCOUNTER — Ambulatory Visit: Payer: Self-pay | Admitting: Family Medicine

## 2024-10-31 ENCOUNTER — Other Ambulatory Visit: Payer: Self-pay | Admitting: Family Medicine

## 2024-11-12 ENCOUNTER — Telehealth: Payer: Self-pay | Admitting: Family Medicine

## 2024-11-12 DIAGNOSIS — E118 Type 2 diabetes mellitus with unspecified complications: Secondary | ICD-10-CM

## 2024-11-12 NOTE — Telephone Encounter (Signed)
 Copied from CRM [id number]. Topic: Clinical - Medication Question >> Nov 12, 2024  2:18 PM Farrel B wrote: Reason for CRM: pt called on the community line stating that he requested refills via [facility] on yesterday. He stated he just spoke to the pharmacy again and they advised him that the refill request had been supposed to send in the faxed request for refills he stated  pantoprazole  (PROTONIX ) 20 MG tablet, Dulaglutide  (TRULICITY ) 4.5 MG/0.5ML SOAJ, and he stated another medication that starts with an X. I advised the patient I didn't see anything from the pharmacy of yet. I searched history of medications seeking the medication he was speaking of with no luck, before I could offer to submit a refill request he stated he was going to call the [facility] and he disconnected call

## 2024-11-12 NOTE — Telephone Encounter (Signed)
 Copied from CRM [id number]. Topic: Clinical - Medication Refill >> Nov 12, 2024  2:27 PM Nathanel BROCKS wrote: Medication: Dulaglutide  (TRULICITY ) 4.5 MG/0.5ML  xigduo    Has the patient contacted their pharmacy? Yes   This is the patient's preferred pharmacy:  [facility] [address], [place] - [address] [place] [address] Phone: [phone] Fax: [phone]  Is this the correct pharmacy for this prescription? Yes If no, delete pharmacy and type the correct one.   Has the prescription been filled recently? Yes  Is the patient out of the medication? Yes  Has the patient been seen for an appointment in the last year OR does the patient have an upcoming appointment? Yes  Can we respond through MyChart? No  Agent: Please be advised that Rx refills may take up to 3 business days. We ask that you follow-up with your pharmacy.

## 2024-11-12 NOTE — Telephone Encounter (Signed)
 LM for patient to return call and that he was due for a DM f/u in Nov.

## 2024-11-13 MED ORDER — TRULICITY 4.5 MG/0.5ML ~~LOC~~ SOAJ: 4.5000 mg | SUBCUTANEOUS | 3 refills | Status: AC

## 2024-11-13 MED ORDER — PANTOPRAZOLE SODIUM 20 MG PO TBEC: 20.0000 mg | DELAYED_RELEASE_TABLET | Freq: Two times a day (BID) | ORAL | 0 refills | Status: AC

## 2024-11-14 ENCOUNTER — Telehealth: Payer: Self-pay | Admitting: Pharmacy Technician

## 2024-11-14 ENCOUNTER — Other Ambulatory Visit (HOSPITAL_COMMUNITY): Payer: Self-pay

## 2024-11-14 NOTE — Telephone Encounter (Signed)
 Pharmacy Patient Advocate Encounter   Received notification from Onbase CMM KEY that prior authorization for Trulicity  4.5MG /0.5ML auto-injectors is required/requested.   Insurance verification completed.   The patient is insured through [employer].   Per test claim: PA required; PA submitted to above mentioned insurance via Latent Key/confirmation #/[id number] Status is pending

## 2024-11-14 NOTE — Telephone Encounter (Signed)
 Pharmacy Patient Advocate Encounter  Received notification from [employer] that Prior Authorization for Xigduo  XR 10-500MG  er tablets has been APPROVED from 11/14/2024 to 11/05/2098. Unable to obtain price due to refill too soon rejection, last fill date 11/14/2024 next available fill date03/24/2026.   PA #/Case ID/Reference #: [id number]

## 2024-11-14 NOTE — Telephone Encounter (Signed)
 Pharmacy Patient Advocate Encounter   Received notification from Onbase CMM KEY that prior authorization for Xigduo  XR 10-500MG  er tablets is required/requested.   Insurance verification completed.   The patient is insured through [employer].   Per test claim: PA required; PA submitted to above mentioned insurance via Latent Key/confirmation #/EOC [facility] Status is pending

## 2024-11-14 NOTE — Telephone Encounter (Signed)
 Pharmacy Patient Advocate Encounter  Received notification from [employer] that Prior Authorization for Trulicity  4.5MG /0.5ML auto-injectors has been APPROVED from 11/14/2024 to 11/14/2025. Unable to obtain price due to refill too soon rejection, last fill date 11/14/2024 next available fill date01/22/2026.   PA #/Case ID/Reference #: [id number]

## 2024-11-14 NOTE — Telephone Encounter (Unsigned)
 Copied from CRM [id number]. Topic: Clinical - Medication Prior Auth >> Nov 14, 2024 10:44 AM Wess RAMAN wrote: Reason for CRM: Patient states he need prior authorization for Dulaglutide  (TRULICITY ) 4.5 MG/0.5ML SOAJ and Xigduo   Callback #: [id number]  Pharmacy: [facility] [address], [place] - [address] [place] [address] Phone: [phone] Fax: [phone] Hours: Not open 24 hours

## 2024-11-25 ENCOUNTER — Other Ambulatory Visit: Payer: Self-pay | Admitting: Family Medicine

## 2024-11-25 DIAGNOSIS — I10 Essential (primary) hypertension: Secondary | ICD-10-CM

## 2024-12-01 ENCOUNTER — Ambulatory Visit: Payer: Self-pay | Admitting: Family Medicine
